# Patient Record
Sex: Male | Born: 1950 | Race: Black or African American | Hispanic: No | Marital: Married | Smoking: Current every day smoker
Health system: Southern US, Community
[De-identification: ages and names within clinical notes are randomized; demographics above are authoritative.]

## PROBLEM LIST (undated history)

## (undated) DIAGNOSIS — R0600 Dyspnea, unspecified: Secondary | ICD-10-CM

## (undated) DIAGNOSIS — F17200 Nicotine dependence, unspecified, uncomplicated: Secondary | ICD-10-CM

## (undated) DIAGNOSIS — I10 Essential (primary) hypertension: Secondary | ICD-10-CM

## (undated) DIAGNOSIS — E7800 Pure hypercholesterolemia, unspecified: Secondary | ICD-10-CM

## (undated) HISTORY — DX: Essential (primary) hypertension: I10

## (undated) HISTORY — DX: Pure hypercholesterolemia, unspecified: E78.00

## (undated) HISTORY — DX: Nicotine dependence, unspecified, uncomplicated: F17.200

---

## 2001-07-12 ENCOUNTER — Encounter: Admission: RE | Admit: 2001-07-12 | Discharge: 2001-07-12 | Payer: Self-pay | Admitting: Family Medicine

## 2001-07-26 ENCOUNTER — Encounter: Admission: RE | Admit: 2001-07-26 | Discharge: 2001-07-26 | Payer: Self-pay | Admitting: Family Medicine

## 2002-05-20 ENCOUNTER — Emergency Department (HOSPITAL_COMMUNITY): Admission: EM | Admit: 2002-05-20 | Discharge: 2002-05-20 | Payer: Self-pay | Admitting: Emergency Medicine

## 2002-06-03 ENCOUNTER — Encounter: Admission: RE | Admit: 2002-06-03 | Discharge: 2002-06-03 | Payer: Self-pay | Admitting: Family Medicine

## 2002-06-06 ENCOUNTER — Encounter: Admission: RE | Admit: 2002-06-06 | Discharge: 2002-06-06 | Payer: Self-pay | Admitting: Family Medicine

## 2002-06-13 ENCOUNTER — Encounter: Admission: RE | Admit: 2002-06-13 | Discharge: 2002-06-13 | Payer: Self-pay | Admitting: Family Medicine

## 2003-07-14 ENCOUNTER — Encounter: Admission: RE | Admit: 2003-07-14 | Discharge: 2003-07-14 | Payer: Self-pay | Admitting: Sports Medicine

## 2004-05-20 ENCOUNTER — Encounter: Admission: RE | Admit: 2004-05-20 | Discharge: 2004-05-20 | Payer: Self-pay | Admitting: Family Medicine

## 2004-06-03 ENCOUNTER — Encounter: Admission: RE | Admit: 2004-06-03 | Discharge: 2004-06-03 | Payer: Self-pay | Admitting: Family Medicine

## 2005-05-23 ENCOUNTER — Ambulatory Visit: Payer: Self-pay | Admitting: Family Medicine

## 2005-07-23 ENCOUNTER — Ambulatory Visit: Payer: Self-pay | Admitting: Family Medicine

## 2005-10-04 ENCOUNTER — Emergency Department (HOSPITAL_COMMUNITY): Admission: AD | Admit: 2005-10-04 | Discharge: 2005-10-04 | Payer: Self-pay | Admitting: Family Medicine

## 2005-10-06 ENCOUNTER — Ambulatory Visit: Payer: Self-pay | Admitting: Family Medicine

## 2005-10-07 ENCOUNTER — Ambulatory Visit: Payer: Self-pay | Admitting: Family Medicine

## 2005-10-14 ENCOUNTER — Ambulatory Visit: Payer: Self-pay | Admitting: Family Medicine

## 2006-02-18 ENCOUNTER — Ambulatory Visit: Payer: Self-pay | Admitting: Family Medicine

## 2006-05-22 ENCOUNTER — Ambulatory Visit: Payer: Self-pay | Admitting: Sports Medicine

## 2006-05-25 ENCOUNTER — Ambulatory Visit: Payer: Self-pay | Admitting: Family Medicine

## 2006-08-25 ENCOUNTER — Ambulatory Visit: Payer: Self-pay | Admitting: Sports Medicine

## 2006-12-24 DIAGNOSIS — E7800 Pure hypercholesterolemia, unspecified: Secondary | ICD-10-CM | POA: Insufficient documentation

## 2006-12-24 DIAGNOSIS — F17200 Nicotine dependence, unspecified, uncomplicated: Secondary | ICD-10-CM

## 2006-12-24 HISTORY — DX: Pure hypercholesterolemia, unspecified: E78.00

## 2006-12-24 HISTORY — DX: Nicotine dependence, unspecified, uncomplicated: F17.200

## 2007-06-04 ENCOUNTER — Ambulatory Visit: Payer: Self-pay | Admitting: Family Medicine

## 2009-12-11 ENCOUNTER — Ambulatory Visit (HOSPITAL_COMMUNITY): Admission: RE | Admit: 2009-12-11 | Discharge: 2009-12-11 | Payer: Self-pay | Admitting: Family Medicine

## 2009-12-11 ENCOUNTER — Ambulatory Visit: Payer: Self-pay | Admitting: Family Medicine

## 2009-12-11 DIAGNOSIS — R0989 Other specified symptoms and signs involving the circulatory and respiratory systems: Secondary | ICD-10-CM | POA: Insufficient documentation

## 2009-12-11 DIAGNOSIS — I10 Essential (primary) hypertension: Secondary | ICD-10-CM

## 2009-12-11 DIAGNOSIS — R0609 Other forms of dyspnea: Secondary | ICD-10-CM

## 2009-12-11 HISTORY — DX: Essential (primary) hypertension: I10

## 2009-12-12 ENCOUNTER — Ambulatory Visit: Payer: Self-pay | Admitting: Family Medicine

## 2009-12-12 ENCOUNTER — Encounter: Payer: Self-pay | Admitting: Family Medicine

## 2009-12-12 LAB — CONVERTED CEMR LAB
ALT: 14 units/L (ref 0–53)
AST: 21 units/L (ref 0–37)
Calcium: 9.5 mg/dL (ref 8.4–10.5)
Chloride: 101 meq/L (ref 96–112)
Creatinine, Ser: 1.13 mg/dL (ref 0.40–1.50)
Sodium: 138 meq/L (ref 135–145)
Total Bilirubin: 0.5 mg/dL (ref 0.3–1.2)
Total CHOL/HDL Ratio: 3.4
Total Protein: 8.2 g/dL (ref 6.0–8.3)
VLDL: 13 mg/dL (ref 0–40)

## 2009-12-18 ENCOUNTER — Encounter: Payer: Self-pay | Admitting: Family Medicine

## 2009-12-18 ENCOUNTER — Ambulatory Visit (HOSPITAL_COMMUNITY): Admission: RE | Admit: 2009-12-18 | Discharge: 2009-12-18 | Payer: Self-pay | Admitting: Family Medicine

## 2009-12-20 ENCOUNTER — Encounter: Payer: Self-pay | Admitting: Family Medicine

## 2009-12-28 ENCOUNTER — Ambulatory Visit: Payer: Self-pay | Admitting: Family Medicine

## 2009-12-28 LAB — CONVERTED CEMR LAB

## 2010-02-18 ENCOUNTER — Ambulatory Visit: Payer: Self-pay | Admitting: Family Medicine

## 2010-02-18 LAB — CONVERTED CEMR LAB

## 2010-11-26 NOTE — Assessment & Plan Note (Signed)
Summary: cpe,df   Vital Signs:  Patient profile:   60 year old male Height:      69 inches Weight:      166 pounds BMI:     24.60 BSA:     1.91 O2 Sat:      98 % on Room air Temp:     98.1 degrees F Pulse rate:   56 / minute BP sitting:   186 / 98  Vitals Entered By: Christen Bame CMA (December 11, 2009 4:00 PM)  O2 Flow:  Room air  Serial Vital Signs/Assessments:  Comments: 4:01 PM Manual BP: 182/100 By: Christen Bame CMA   CC: CPE Is Patient Diabetic? No Pain Assessment Patient in pain? no        Primary Care Provider:  Lynne Leader MD  CC:  CPE.  History of Present Illness: Donald Pacheco comes to clinc asymptomatic wanting a physical exam.  However the visit was switched due to his hypertension.  Hypertension: Never has been diagnosed with HTN before today.  No chest pains, headaches, dyspnea, palpitations, dizzyness, or PND.  He will occasionally get out of breath when climbing several flights of stairs.  However he can walk from his car to the clinic without becoming dyspnec. Only exerbating factor today is stress in his life. His father is very sick, and has been debilitated.  Donald Pacheco is caring for his father, and taking over power of attorney.  He is copeing well but does note that he is more stressed than normal.   Habits & Providers  Alcohol-Tobacco-Diet     Tobacco Status: current     Tobacco Counseling: to quit use of tobacco products     Cigarette Packs/Day: 0.5  Current Medications (verified): 1)  Hydrochlorothiazide 25 Mg Tabs (Hydrochlorothiazide) .Marland Kitchen.. 1 By Mouth Daily 2)  Norvasc 10 Mg Tabs (Amlodipine Besylate) .Marland Kitchen.. 1 By Mouth Daily  Allergies (verified): No Known Drug Allergies  Past History:  Past Medical History: L periorbital cyst- infected 2006, Orbital  Abscess-2003 Noted to have SBPs in office of 180 11/2009  Past Surgical History: None  Social History: Packs/Day:  0.5  Review of Systems       Please see HPI  Physical  Exam  General:  Vs noted and rechecked. Abuliatory pulseox = 97% with several laps of the office. Well appearing male in NAD Mouth:  MMM Lungs:  CTABL Heart:  normal rate, regular rhythm, no murmur, no gallop, no rub, no JVD, and fixed split S2.   Abdomen:  NABS, NT, ND Soft abdominal bruit noted. Appears to be midline.  No pulsitile masses noted.  Extremities:  Budding of the fingers noted.  Has been this way since childhood. Psych:  Oriented X3, memory intact for recent and remote, normally interactive, good eye contact, not anxious appearing, not depressed appearing, not agitated, not suicidal, and not homicidal.     Impression & Recommendations:  Problem # 1:  HYPERTENSION, BENIGN ESSENTIAL (ICD-401.1) Assessment New No urgency or emergency as he does not have acute enf organ damage.  No need for hospitilization at this time.  Plan: Start two anti-hypertensive agents.  Get baseline labs, and EKG.   Will f/u in 2 weeks.   His updated medication list for this problem includes:    Hydrochlorothiazide 25 Mg Tabs (Hydrochlorothiazide) .Marland Kitchen... 1 by mouth daily    Norvasc 10 Mg Tabs (Amlodipine besylate) .Marland Kitchen... 1 by mouth daily  Orders: 12 Lead EKG (12 Lead EKG) 2 D Echo (2 D  Echo) [facility]- Est  Level 4 (99214)Future Orders: Comp Met-[facility] [id number]) ... 12/12/2009 Lipid-[facility] [id number]) ... 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  This will also aid in the characterisitation of his HTN and management.  May need to be on ACEi vs beta blocker.  Orders: [employer]- Est  Level 4 [id number])  Problem # 3:  ABDOMINAL BRUIT ([id number]) Assessment: New  Will check BMP at this time.  Will avoid ACEi unless he developes an indication.  Will need further studies in the future.  Orders: [facility]- Est  Level 4 [id number])  Problem # 4:  TOBACCO  DEPENDENCE (ICD-305.1) Assessment: Unchanged  Will work towards tobacco cessation.  Donald Worrell is in the precontemplation.  Orders: [facility]- Est  Level 4 [id number])  Complete Medication List: 1)  Hydrochlorothiazide 25 Mg Tabs (Hydrochlorothiazide) .Marland Kitchen.. 1 by mouth daily 2)  Norvasc 10 Mg Tabs (Amlodipine besylate) .Marland Kitchen.. 1 by mouth daily  Other Orders: Pulse Oximetry- [place] [id number])  Patient Instructions: 1)  Thank you for seeing me today. 2)  If you have chest pain, difficulty breathing, fevers over 102 that does not get better with tylenol please call us or see a doctor.  3)  Please come back tomorrow morning for a lab draw.  Do not eat or drink anything but water.   4)  Please schedule an appointment with me in 2 weeks. 5)  Tobacco is very bad for your health and your loved ones ! You should stop smoking !  6)  Stop smoking tips: Choose a quit date. Cut down before the quit date. Decide what you will do as a substitute when you feel the urge to smoke(gum, toothpick, exercise).  Prescriptions: NORVASC 10 MG TABS (AMLODIPINE BESYLATE) 1 by mouth daily  #30 x 6   Entered and Authorized by:   Lynne Leader MD   Signed by:   Lynne Leader MD on 12/11/2009   Method used:   Electronically to        [facility] (retail)       [place], [place]  [id number]       Ph: [phone]       Fax: [phone]   RxIDFS:[id number] HYDROCHLOROTHIAZIDE 25 MG TABS (HYDROCHLOROTHIAZIDE) 1 by mouth daily  #30 x 6   Entered and Authorized by:   Lynne Leader MD   Signed by:   Lynne Leader MD on 12/11/2009   Method used:   Electronically to        [facility] (retail)       [address]       [place], [place]  [id number]       Ph: [phone]       Fax: [phone]   RxID:   [id number]

## 2010-11-26 NOTE — Assessment & Plan Note (Signed)
Summary: F/U LABS/[facility]   Vital Signs:  Patient profile:   60 year old male Weight:      163.4 pounds Temp:     97.9 degrees F oral Pulse rate:   82 / minute Pulse rhythm:   regular BP sitting:   148 / 84  (left arm)  Vitals Entered By: Audelia Hives CMA (December 28, 2009 2:46 PM)  Primary Care Provider:  Lynne Leader MD  CC:  f/u BP and and smoking.  History of Present Illness: Donald Pacheco has thought a great deal about his smoking and would like to quit smoking today. He smokes 1/2 PPD with a 30 pack year history.  He smokes within 10 minuts of waking up. He does not wake up to smoke.  He has quit for 1 month in the past.   HTN:  He has been taking his BP medications as directed and feeling well.  He deneis any chest pain, palpatations, dyspnea or headaches.     Habits & Providers  Alcohol-Tobacco-Diet     Tobacco Status: current     Tobacco Counseling: to quit use of tobacco products     Cigarette Packs/Day: 0.5     Year Started: 1970     Pack years: 30  Current Problems (verified): 1)  Abdominal Bruit  (ICD-785.9) 2)  Hypertension, Benign Essential  (ICD-401.1) 3)  Dyspnea On Exertion  (ICD-786.09) 4)  Tobacco Dependence  (ICD-305.1) 5)  Hypercholesterolemia  (ICD-272.0)  Current Medications (verified): 1)  Hydrochlorothiazide 25 Mg Tabs (Hydrochlorothiazide) .Marland Kitchen.. 1 By Mouth Daily 2)  Norvasc 10 Mg Tabs (Amlodipine Besylate) .Marland Kitchen.. 1 By Mouth Daily 3)  Zyban 150 Mg Xr12h-Tab (Bupropion Hcl (Smoking Deter)) .Marland Kitchen.. 1 By Mouth X 3days Then Two Times A Day.  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 Well appearing male in NAD Mouth:  MMM Lungs:  CTABL Heart:  RRR no MRG no splitting today. Abdomen:  NABS, NT, ND Soft abdominal bruit noted. Appears to be midline.  No pulsitile masses noted.  Extremities:  Budding of the fingers noted.  Has been this way since childhood.   Impression & Recommendations:  Problem # 1:  TOBACCO DEPENDENCE (ICD-305.1) Assessment Unchanged  Wants to quit.  Quit Date is March 14th.  Will start Zyban on March 7th. Will call the quitline  on March 7th,14th and a week later. Will f/u with me in April. His updated medication list for this problem includes:    Zyban 150 Mg Xr12h-tab (Bupropion hcl (smoking deter)) .Marland Kitchen... 1 by mouth x 3days then two times a day.  start 7 days before quit date  Orders: [facility]- Est  Level 4 [id number])  Problem # 2:  HYPERTENSION, BENIGN ESSENTIAL (ICD-401.1) Assessment: Improved  BP is resonable control at the time being.  Will f/u in April.  Will avoid any chnages now to let Donald Babcock concentrate on his smoking cessation. His updated medication list for this problem includes:    Hydrochlorothiazide 25 Mg Tabs (Hydrochlorothiazide) .Marland Kitchen... 1 by mouth daily    Norvasc 10 Mg Tabs (Amlodipine besylate) .Marland Kitchen... 1 by mouth daily  BP today: 148/84 Prior BP: 186/98 (12/11/2009)  Labs Reviewed: K+: 4.5 (12/12/2009) Creat: : 1.13 (12/12/2009)   Chol: 252 (12/12/2009)   HDL: 74 (12/12/2009)   LDL: 165 (12/12/2009)   TG: 63 (12/12/2009)  Orders: [facility]- Est  Level 4 (99214)  Problem # 5:  HYPERCHOLESTEROLEMIA (ICD-272.0) Assessment: New  Elevated LDL.  Goal should be 130 as he is a male with HTN and smoking.   Will concentrate on smoking cessation now and then work with diet and exercise modification to lower lipids.  Will consider a statin if no change.  Labs Reviewed: SGOT: 21 (12/12/2009)   SGPT: 14 (12/12/2009)   HDL:74 (12/12/2009)  LDL:165 (12/12/2009)  Chol:252 (12/12/2009)  Trig:63 (12/12/2009)  Orders: [facility]- Est  Level 4 [id number])  Complete Medication List: 1)  Hydrochlorothiazide 25 Mg Tabs (Hydrochlorothiazide) .Marland Kitchen.. 1 by mouth daily 2)  Norvasc 10 Mg Tabs (Amlodipine besylate) .Marland Kitchen.. 1 by mouth daily 3)  Zyban 150 Mg Xr12h-tab (Bupropion hcl (smoking deter)) .Marland Kitchen.. 1 by mouth x 3days then two times a day.  start 7 days before quit date  Patient Instructions: 1)  Thank you for seeing me today. 2)  Monday March 14th is the quit date 3)  On Monday march 7th start taking the Zyban 1 pill daily.  On Thursday take it twice a day.  Then on Monday the 14th stop smoking. 4)  Please call the Quitline.  1-800-Quit-Now Today and when you start taking the medicine and on your quit date.   5)  Check in with me in early April.  We will talk about your blood pressure then.   Prescriptions: ZYBAN 150 MG XR12H-TAB (BUPROPION HCL (SMOKING DETER)) 1 by mouth x 3days then two times a day.  Start 7 days before quit date  #30 x 4   Entered and Authorized by:   Lynne Leader MD   Signed by:   Lynne Leader MD on 12/28/2009   Method used:   Print then Give to Patient   RxID:   [id number]    Prevention & Chronic Care Immunizations   Influenza vaccine: Not documented   Influenza vaccine deferral: Deferred  (12/28/2009)    Tetanus booster: 05/01/2005: Done.    Pneumococcal vaccine: Not documented  Colorectal Screening   Hemoccult: Done.  (05/01/2005)   Hemoccult action/deferral: Deferred  (12/28/2009)    Colonoscopy: Not documented   Colonoscopy action/deferral: Deferred  (12/28/2009)  Other Screening   PSA: Not documented   PSA action/deferral: Discussed-decision deferred  (12/28/2009)   Smoking  status: current  (12/28/2009)   Smoking cessation counseling: YES  (12/11/2009)  Lipids   Total Cholesterol: 252  (12/12/2009)   LDL: 165  (12/12/2009)   LDL Direct: Not documented   HDL: 74  (12/12/2009)   Triglycerides: 63  (12/12/2009)    SGOT (AST): 21  (12/12/2009)   SGPT (ALT): 14  (12/12/2009)   Alkaline phosphatase: 70  (12/12/2009)   Total bilirubin: 0.5  (12/12/2009)    Lipid flowsheet reviewed?: Yes   Progress toward LDL goal: Unchanged  Hypertension   Last Blood Pressure: 148 / 84  (12/28/2009)   Serum creatinine: 1.13  (12/12/2009)   Serum potassium 4.5  (  12/12/2009)    Hypertension flowsheet reviewed?: Yes   Progress toward BP goal: Improved  Self-Management Support :   Personal Goals (by the next clinic visit) :      Personal blood pressure goal: 140/90  (12/28/2009)     Personal LDL goal: 130  (12/28/2009)    Patient will work on the following items until the next clinic visit to reach self-care goals:     Medications and monitoring: take my medicines every day, check my blood pressure, weigh myself weekly  (12/28/2009)    Hypertension self-management support: Not documented    Lipid self-management support: Not documented

## 2010-11-26 NOTE — Assessment & Plan Note (Signed)
Summary: f/u meds,df   Vital Signs:  Patient profile:   60 year old male Height:      69 inches Weight:      159 pounds BMI:     23.57 Temp:     98.2 degrees F oral Pulse rate:   80 / minute BP sitting:   128 / 85  (right arm) Cuff size:   regular  Vitals Entered By: Schuyler Amor CMA (February 18, 2010 3:16 PM) CC: F/U meds Is Patient Diabetic? No Pain Assessment Patient in pain? no        Primary Care Provider:  Lynne Leader MD  CC:  F/U meds.  History of Present Illness: Donald Pacheco presents to clinic today to follow up his HTN, Cholesterol and Smoking  HTN Donald Pacheco is taking HCTZ and Amlodipine as directed and doing well. He denies any chest pain, syncope, palpitation or dizzyness.   He is happy with his BP control.  Dyslipidemia: Donald Pacheco knows that his cholestrol is high and is still smoking.  He is OK with trying a cholesterol medication on the condition that he can stop it if he stops smoking.   Smoking:  Donald Pacheco did not keep his quit date.  His father who was very sick recently died. Understandibly Donald Pacheco decided that now was not a good time to quit.  He remains eager and determined to quit smoking.  He thinks that some time next month will be a good time. He does still have the zyban Rx.   Habits & Providers  Alcohol-Tobacco-Diet     Tobacco Status: current     Tobacco Counseling: to quit use of tobacco products     Cigarette Packs/Day: 0.5  Current Problems (verified): 1)  Screening, Colon Cancer  (ICD-V76.51) 2)  Abdominal Bruit  (ICD-785.9) 3)  Hypertension, Benign Essential  (ICD-401.1) 4)  Dyspnea On Exertion  (ICD-786.09) 5)  Tobacco Dependence  (ICD-305.1) 6)  Hypercholesterolemia  (ICD-272.0)  Current Medications (verified): 1)  Hydrochlorothiazide 25 Mg Tabs (Hydrochlorothiazide) .Marland Kitchen.. 1 By Mouth Daily 2)  Norvasc 10 Mg Tabs (Amlodipine Besylate) .Marland Kitchen.. 1 By Mouth Daily 3)  Zyban 150 Mg Xr12h-Tab (Bupropion Hcl (Smoking Deter)) .Marland Kitchen.. 1 By Mouth  X 3days Then Two Times A Day.  Start 7 Days Before Quit Date 4)  Pravastatin Sodium 40 Mg Tabs (Pravastatin Sodium) .Marland Kitchen.. 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    Physical Exam  General:  Vs noted.  Well male in NAD Mouth:  MMM Lungs:  CTABL Heart:  RRR no MRG no splitting today. Abdomen:  NABS, NT, ND  Extremities:  Budding of the fingers noted.  Has been this way since  childhood. Non edemetus BL LE Psych:  normally interactive, good eye contact, not anxious appearing, and not depressed appearing.     Impression & Recommendations:  Problem # 1:  HYPERTENSION, BENIGN ESSENTIAL (ICD-401.1) Assessment Improved  At goal today. No changes planned.   His updated medication list for this problem includes:    Hydrochlorothiazide 25 Mg Tabs (Hydrochlorothiazide) .Marland Kitchen... 1 by mouth daily    Norvasc 10 Mg Tabs (Amlodipine besylate) .Marland Kitchen... 1 by mouth daily  BP today: 128/85 Prior BP: 148/84 (12/28/2009)  Labs Reviewed: K+: 4.5 (12/12/2009) Creat: :  1.13 (12/12/2009)   Chol: 252 (12/12/2009)   HDL: 74 (12/12/2009)   LDL: 165 (12/12/2009)   TG: 63 (12/12/2009)  Orders: [facility]- Est  Level 4 (99214)  Problem # 2:  HYPERCHOLESTEROLEMIA (ICD-272.0) Assessment: Unchanged  As Donald Pacheco has not quit tobacco plan to start pravastatin for a LDL goal of 130.  Will plan to follow up his lipids in 2 months. His updated medication list for this problem includes:    Pravastatin Sodium 40 Mg Tabs (Pravastatin sodium) .Marland Kitchen... 1 by mouth at night  Labs Reviewed: SGOT: 21 (12/12/2009)   SGPT: 14 (12/12/2009)   HDL:74 (12/12/2009)  LDL:165 (12/12/2009)  Chol:252 (12/12/2009)  Trig:63 (12/12/2009)  Orders: [facility]- Est  Level 4 [id number])  Problem # 3:  TOBACCO DEPENDENCE (ICD-305.1) Assessment: Unchanged  Did not quit as noted earlier. Plan to follow up smoking in 2 months.  Will have a quitdate next month.  Pt is determined.  His updated medication list for this problem includes:    Zyban 150 Mg Xr12h-tab (Bupropion hcl (smoking deter)) .Marland Kitchen... 1 by mouth x 3days then two times a day.  start 7 days before quit date  Encouraged smoking cessation and discussed different methods for smoking cessation.   Orders: [facility]- Est  Level 4 [id number])  Problem # 4:  Preventive Health Care (ICD-V70.0) Discussed colon cancer screening will do hemeoccult cards for now.  Pt understands how to use them and to mail them back, Discussed PSA testing. Patient elects to not test at this time.   Complete Medication List: 1)  Hydrochlorothiazide 25 Mg Tabs (Hydrochlorothiazide) .Marland Kitchen.. 1 by mouth daily 2)  Norvasc 10 Mg Tabs (Amlodipine besylate) .Marland Kitchen.. 1 by mouth daily 3)  Zyban 150 Mg Xr12h-tab (Bupropion hcl (smoking deter)) .Marland Kitchen.. 1 by mouth x 3days then two times a day.  start 7 days before quit date 4)  Pravastatin Sodium 40 Mg Tabs (Pravastatin sodium) .Marland Kitchen.. 1 by mouth at night  Patient Instructions: 1)  Thank you for seeing me today. 2)  Please set a keep a quitdate.  Call the  quitline at 1-800-quitnow 3)  Please schedule a follow-up appointment in 2 months.  4)  We will get blood tests then. 5)  Come back if you feel bad.  6)  Please send in the hemeoccult cards. Prescriptions: PRAVASTATIN SODIUM 40 MG TABS (PRAVASTATIN SODIUM) 1 by mouth at night  #30 x 12   Entered and Authorized by:   Lynne Leader MD   Signed by:   Lynne Leader MD on 02/18/2010   Method used:   Electronically to        [facility] (retail)       [address]       [place], [place]  [id number]       Ph: [phone]       Fax: [phone]   RxIDMB:[id number]    Prevention &  Chronic Care Immunizations   Influenza vaccine: Not documented   Influenza vaccine deferral: Not available  (02/18/2010)    Tetanus booster: 05/01/2005: Done.    Pneumococcal vaccine: Not documented  Colorectal Screening   Hemoccult: Done.  (05/01/2005)   Hemoccult action/deferral: Ordered  (02/18/2010)    Colonoscopy: Not documented   Colonoscopy action/deferral: Deferred  (12/28/2009)  Other Screening   PSA: Not documented   PSA action/deferral: Discussed-PSA declined  (02/18/2010)   Smoking status: current  (02/18/2010)   Smoking cessation counseling: YES  (12/11/2009)  Lipids   Total Cholesterol: 252  (12/12/2009)   LDL: 165  (12/12/2009)   LDL Direct: Not documented   HDL: 74  (12/12/2009)   Triglycerides: 63  (12/12/2009)    SGOT (AST): 21  (12/12/2009)   SGPT (ALT): 14  (12/12/2009)   Alkaline phosphatase: 70  (12/12/2009)   Total bilirubin: 0.5  (12/12/2009)    Lipid flowsheet reviewed?: Yes   Progress toward LDL goal: Unchanged  Hypertension   Last Blood Pressure: 128 / 85  (02/18/2010)   Serum creatinine: 1.13  (12/12/2009)   Serum potassium 4.5  (12/12/2009)    Hypertension flowsheet reviewed?: Yes   Progress toward BP goal: At goal  Self-Management Support :   Personal Goals (by the next clinic visit) :      Personal blood pressure goal:  140/90  (12/28/2009)     Personal LDL goal: 130  (12/28/2009)    Patient will work on the following items until the next clinic visit to reach self-care goals:     Medications and monitoring: take my medicines every day, bring all of my medications to every visit, weigh myself weekly  (02/18/2010)    Hypertension self-management support: Not documented    Lipid self-management support: Not documented

## 2011-03-25 ENCOUNTER — Inpatient Hospital Stay (INDEPENDENT_AMBULATORY_CARE_PROVIDER_SITE_OTHER)
Admission: RE | Admit: 2011-03-25 | Discharge: 2011-03-25 | Disposition: A | Discharge status: Home / Self Care | Payer: Self-pay | Source: Ambulatory Visit | Attending: Family Medicine | Admitting: Family Medicine

## 2011-03-25 DIAGNOSIS — H109 Unspecified conjunctivitis: Secondary | ICD-10-CM

## 2013-02-18 ENCOUNTER — Encounter: Payer: Self-pay | Admitting: Family Medicine

## 2013-02-18 ENCOUNTER — Ambulatory Visit (INDEPENDENT_AMBULATORY_CARE_PROVIDER_SITE_OTHER): Payer: Self-pay | Admitting: Family Medicine

## 2013-02-18 VITALS — BP 150/97 | HR 69 | Ht 68.0 in | Wt 155.0 lb

## 2013-02-18 DIAGNOSIS — E7800 Pure hypercholesterolemia, unspecified: Secondary | ICD-10-CM

## 2013-02-18 DIAGNOSIS — L989 Disorder of the skin and subcutaneous tissue, unspecified: Secondary | ICD-10-CM

## 2013-02-18 DIAGNOSIS — I10 Essential (primary) hypertension: Secondary | ICD-10-CM

## 2013-02-18 DIAGNOSIS — R0989 Other specified symptoms and signs involving the circulatory and respiratory systems: Secondary | ICD-10-CM

## 2013-02-18 DIAGNOSIS — F17200 Nicotine dependence, unspecified, uncomplicated: Secondary | ICD-10-CM

## 2013-02-18 MED ORDER — HYDROCHLOROTHIAZIDE 25 MG PO TABS: 25.0000 mg | ORAL_TABLET | Freq: Every day | ORAL | Status: DC

## 2013-02-18 NOTE — Assessment & Plan Note (Signed)
Advised cessation. Patient wants to quit but not ready for quit date. Will refer to [facility]. Also advised 1-800 quit now.

## 2013-02-18 NOTE — Assessment & Plan Note (Signed)
Not heard on exam today  

## 2013-02-18 NOTE — Progress Notes (Addendum)
Subjective:  Patient presents today to reestablish care (last seen 11/2009). Technically here for SDA for eye knot but as history had not been updated in epic, this was fully reviewed. Chief complaint-noted.   1. Right eye knot x 1 year-  Small knot over right eye in eyebrow going on for a year. Felt knot and perhaps some fluid in upper eyelid during this tmie but never really bothered patient.  Over 1-2 months has increased in size. No pain, tingling. No blurry vision. Sometime eyelid will be a little more shut. No fevers/chills/nausea/vomiting. He does push on it a lot and rub it just because he notices it and this causes some redness but no expanding redness. No drainage.   4-5 years ago had similar knot on the left size and they did an incision and drainage at that time and it has not recurred. Did not have fevers/redness. Does not remember them calling it an abscess. Did have smelly drainage-not sure if it was a sebaceous cyst.   2. Tobacco abuse-8/10 importance, 7/10 readiness, 5/10 confidence. Asked patient if he would like to set a quit date and he is nto ready currently. Is interested in 1-800-quit-now (and ? Free patches) as well as [facility]. Has quit cold [place] for 6-9 months several years ago. Looks like has tried welbutrin per meds listed. Has tried patches within last 5 years but didn't have regular follow up.   3. Hypertension- BP Readings from Last 3 Encounters:  02/18/13 150/97  02/18/10 128/85  12/28/09 148/84   Home BP monitoring-no Compliant with medications-previously on hctz and amlodipine. No current medications.  Denies any CP, HA, SOB, blurry vision, LE edema, transient weakness, orthopnea, PND.   The following were reviewed and entered/updated in epic: Past Medical History  Diagnosis Date  . HYPERCHOLESTEROLEMIA 12/24/2006  . TOBACCO DEPENDENCE 12/24/2006    40 years 1/2 PPD. 20 pack years.   Marland Kitchen HYPERTENSION, BENIGN ESSENTIAL 12/11/2009   No past surgeries.   Medications- reviewed and updated (no current meds) Reviewed problem list.  Allergies-reviewed and updated History   Social History  . Marital Status: Married   Social History Main Topics  . Smoking status: Current Every Day Smoker -- 0.50 packs/day for 40 years    Types: Cigarettes  . Smokeless tobacco: None  . Alcohol Use: 1.5 oz/week    3 drink(s) per week  . Drug Use: No     Comment: marijuana many years ago  . Sexually Active: Yes -- Male partner(s)    Birth Control/ Protection: None     Comment: married   Other Topics Concern  . 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       Married with 3 kids (5 grandkids).     ROS--See HPI   Objective: BP 150/97  Pulse 69  Ht 5\' 8"  (1.727 m)  Wt 155 lb (70.308 kg)  BMI 23.57 kg/m2 Gen: NAD, resting comfortably in chair HEENT: NCAT, MMM, PERRLA   Skin: 2 cm mobile nodule in right eyebrow with slight erythema over nodule itself but not in surrounding area. Does have slight droop in right eyelid (chronic since nodule has enlarged per patient). Poor dentition (multiple caries)  CV: RRR no mrg  Lungs: CTAB  Abd: soft/nontender/nondistended/normal bowel sounds . Do not hear bruit that was previously noted.  Ext: no edema  Skin: warm and dry, no rash  Neuro: grossly normal, moves all extremities   Assessment/Plan:  Advised patient to see dentist but has no insurance.

## 2013-02-18 NOTE — Assessment & Plan Note (Signed)
Appears to be sebaceous cyst (had Dr. Walker Kehr observe it as well) and previous left eye lesion sounds like cyst as well. Patient deferred i+D today and will have this done with PCP in coming weeks (patient wants done for cosmetic reasons but also for droop of eyelid associated with it.

## 2013-02-18 NOTE — Assessment & Plan Note (Signed)
Previously on statin. Defers labwork today (no insurance). Patient will discuss with PCP whether to start statin without baseline labs.

## 2013-02-18 NOTE — Assessment & Plan Note (Signed)
Poorly controlled. Restart home HCTZ previously taken. Follow up at Dr. Ebony Hail next available appointment. May need additional agent (left amlodipine on list)

## 2013-02-18 NOTE — Patient Instructions (Signed)
This knot looks like a sebaceous cyst. I want you to come back to see Dr. Venetia Maxon so he can do an incision and drainage (this can take some time as we have to numb you up).   For your blood pressure, restart hydrochlorothiazide which was sent to your pharmacy. Also, a baby aspirin a day would be a good idea for you to start. You deferred blood work today but we need to check your cholesterol eventually.   For your smoking, try 1-800-quit now to see if they have free patches. When you are ready to quit, let us know and we can help you further.  I am going to tell [facility] team about you and they may give you a call.   Follow up at his next available appointment, Dr. Yong Channel  For insurance, start with healthcare.org to try to obtain insurance. The I+D procedure-I am not sure of the cost-but you can ask at the front.

## 2013-02-25 ENCOUNTER — Encounter: Payer: Self-pay | Admitting: Family Medicine

## 2013-02-25 ENCOUNTER — Ambulatory Visit (INDEPENDENT_AMBULATORY_CARE_PROVIDER_SITE_OTHER): Payer: Self-pay | Admitting: Family Medicine

## 2013-02-25 VITALS — BP 146/94 | HR 82 | Temp 98.2°F | Ht 68.0 in | Wt 149.2 lb

## 2013-02-25 DIAGNOSIS — H02829 Cysts of unspecified eye, unspecified eyelid: Secondary | ICD-10-CM

## 2013-02-25 DIAGNOSIS — I10 Essential (primary) hypertension: Secondary | ICD-10-CM

## 2013-02-25 DIAGNOSIS — L723 Sebaceous cyst: Secondary | ICD-10-CM

## 2013-02-25 DIAGNOSIS — H02823 Cysts of right eye, unspecified eyelid: Secondary | ICD-10-CM

## 2013-02-25 DIAGNOSIS — L089 Local infection of the skin and subcutaneous tissue, unspecified: Secondary | ICD-10-CM | POA: Insufficient documentation

## 2013-02-25 DIAGNOSIS — E7800 Pure hypercholesterolemia, unspecified: Secondary | ICD-10-CM

## 2013-02-25 MED ORDER — HYDROCODONE-ACETAMINOPHEN 5-325 MG PO TABS: 1.0000 | ORAL_TABLET | ORAL | Status: DC | PRN

## 2013-02-25 MED ORDER — AMLODIPINE BESYLATE 10 MG PO TABS: 10.0000 mg | ORAL_TABLET | Freq: Every day | ORAL | Status: DC

## 2013-02-25 MED ORDER — DOXYCYCLINE HYCLATE 100 MG PO TABS: 100.0000 mg | ORAL_TABLET | Freq: Two times a day (BID) | ORAL | Status: DC

## 2013-02-25 NOTE — Patient Instructions (Signed)
Thank you for coming in, today. It was nice to meet you. For your eye:  Change the dressing with fresh gauze and tape, 1-2 times per day.  DO NOT remove the packing inside your skin when you change your dressing.  Use ice in a bag or an icepack as needed for pain and to help with the swelling.  Use ice "on for fifteen minutes, off for fifteen minutes" for an hour or two at a time.  Make sure to take the antibiotic I prescribed until you finish the pills.  The antibiotic is doxycycline, 100 mg pills, 1 pill, twice a day, for 10 days.  You can also take hydrocodone as needed for pain.  Make an appointment to come back on Monday to have your eye checked.  We might re-pack it, at that point, and we will see how it is healing.  If you have ANY of the following, call the clinic or go to the emergency room:   Persistent fever over 100.4.    (You may have some increased temperature today, due to the cut and draining).   Increased redness and swelling around your eye.   Severe pain with moving your eye or inability to move your eye.   Nausea/vomiting that persists.   Any of these could mean that your infection is spreading or getting worse. For your blood pressure:  I sent in a prescription for the amlodipine that you have taken before.  Keep taking the HCTZ.  We will check your blood pressure at your follow-up visits and adjust your medicine as we need. We will talk more about your cholesterol in the future. Please feel free to call with any questions or concerns at any time, at [phone]. --Dr. Venetia Maxon

## 2013-02-26 ENCOUNTER — Encounter: Payer: Self-pay | Admitting: Family Medicine

## 2013-02-26 NOTE — Progress Notes (Signed)
Subjective:    Patient ID: Donald Pacheco, male    DOB: [birth year], 62 y.o.   MRN: [id number]  HPI: Pt presents for f/u of BP, to discuss his cholesterol, and to have a sebaceous cyst over his right eye drained and/or removed.  Sebaceous cyst: Cyst over pt's right eye has grown larger over the past few days, is more tender/inflammed, and continues to interfere with his vision and has prevented him from working (pt works for [employer]). The cyst is about twice as big as it was when pt was seen by Dr. Yong Channel on 4/25. He denies systemic symptoms of fever/chills. He has not noticed any surrounding erythema or other rash to suggest cellulitis. He does state that yesterday he was nauseated and vomited twice, which he thought may have been due to his BP medicines; however, he has not had any difficulty or noticed significant side effects with BP meds before (see immediately below).  BP: Pt's BP is improved today. Pt has been compliant with HCTZ as prescribed by Dr. Yong Channel. Denies new headache, change in vision. Pt reportedly tolerated HCTZ and amlodipine in the past (according to previous chart records); pt states that he does not specifically recall any complications/intolerance of medicines but also states he did not remember the names of exactly what meds he had used in the past, either.  Cholesterol: Pt has previously been on a statin but is unable to afford a lab draw at this time. Pt would prefer to have labs checked before starting a new medication. Pt reportedly tolerated a statin well in the past.  Review of Systems: As above.     Objective:   Physical Exam BP 146/94  Pulse 82  Temp(Src) 98.2 F (36.8 C) (Oral)  Ht 5\' 8"  (1.727 m)  Wt 149 lb 3.2 oz (67.677 kg)  BMI 22.69 kg/m2 Gen: non-toxic appearing adult male in NAD, very pleasant and cooperative HEENT: enlarged, ~5 cm x 4 cm cyst-like mass over right eye causing some droop of the upper eyelid, very tender and mildly  erythematous  No surrounding erythema, induration, or tenderness; EOMI bilaterally with clear sclerae and conjunctivae Skin: otherwise warm, dry, intact Cardio: RRR, no murmur appreciated Ext: gait normal, distal pulses intact     Assessment & Plan:  Precepted with Dr. Andria Frames. See problem list notes. Given appearance and enlargement of cyst since pt's last visit, an I&D was performed rather than an attempted intact removal of the cyst.  Procedure Note: I&D of infected sebaceous cyst of face Present personnel: Dr. Andria Frames, Dr. Venetia Maxon, Enid Skeens, CMA After explaining the procedure and obtaining written and verbal consent, the pt was placed in a supine position on an exam table. Pt's right eye was covered with a piece of plain gauze and pt's head was rotated slightly to the right. The skin overlying the swollen, infected cyst over his right eye was cleaned with alcohol and approximately 3 mL of xylocaine plus epinephrine was injected into the skin just inferior to the eyebrow line, into the skin and cyst itself. The cyst and approximately 2 cm of surrounding skin were cleaned and prepped with iodine solution using typical sterile technique. Using a #11 disposable scalpel blade, an approximately 1.5 cm stab incision was made in two motions along the line of local anaesthetic injection as described above. Approximately 20 mL of bloody purulent material was expressed using hand pressure against the cyst. Sterile curved hemostats were used to disrupt as much loculation as possible within the  cyst cavity and small pieces of the cyst wall were removed. Approximately 5 cm of plain packing was introduced into the wound using the sterile hemostat and sterile forceps, with about 1 cm of packing left protruding. The skin around the wound was cleaned with alcohol swabs and a plain gauze dressing was applied over the wound. Estimated blood loss for the procedure was less than 10 mL. Pt tolerated the procedure  well with no immediate complications. Pt and wife were instructed on wound dressing changes and provided with clean supplies. Dr. Andria Frames was present for and assisted with the procedure in its entirety.

## 2013-02-26 NOTE — Assessment & Plan Note (Signed)
A: Discussed with Dr. Andria Frames. Cyst appears roughly twice as large as described in Dr. Ansel Bong note from pt's recent visit, and clinically appears very inflammed and likely infected. No definite systemic symptoms other than single episode of emesis the day prior to this visit; no findings on exam to suggest cellulitis. Given infection, it was felt I&D today with possible excision of cyst later was more appropriate than attempting to remove the cyst intact.  P: I&D in the office, assisted by Dr. Andria Frames per procedure note included with this visit's progress note. Rx provided for doxycycline 100 mg BID for 10 days as well as Norco 5-325 mg 1-2 tabs q4 PRN. Pt is to f/u on Monday 5/5 for reassessment of the cyst and procedure wound, as well as either removal of packing and/or repacking. Pt and wife instructed on the use of cold compresses and regular dressing changes, as well as red flags that would prompt immediate return to clinic or presentation to the ED before his f/u on Monday. Pt was provided with clean materials for dressing changes. Will likely plan to remove cyst if/when it recurs, once the acute infection is resolved.

## 2013-02-27 NOTE — Assessment & Plan Note (Addendum)
A: Previously on a statin medication (Pravachol 40 mg, per chart review). Pt defers lab draw for lipid panel at this time due to cost and no insurance. I would prefer to see current lipid status and evaluate LFT's prior to restarting Pravachol or starting another statin.  P: Will re-address at follow-up; if/when pt is agreeable/able to get labs drawn, will check a fasting lipid panel and a CMP, to evaluate both LFT's and chemistries with kidney function (pt recently restarted on HCTZ, and amlodipine was re-added this visit, for HTN).

## 2013-02-27 NOTE — Assessment & Plan Note (Signed)
A: Remains high, but control improving with HCTZ. Pt agreeable to starting amlodipine, as well (per records, pt has been on this in the past, but does not recall which medicines he has been on, himself).   P: Rx for amlodinpine 10 mg added. Will continue to follow up routinely and monitor for side effects. Hopeful for pt to agree to lab draw in the near future to evaluate kidney function with recently-restarted HCTZ (see HLD problem list note).

## 2013-02-27 NOTE — Progress Notes (Signed)
This encounter was created in error - please disregard.

## 2013-02-27 NOTE — Assessment & Plan Note (Signed)
A: BP remains high, but control is improving with HCTZ. Pt agreeable to re-starting amlodipine, as well (per records, pt has been on this in the past, but does not recall which medications he has been on, himself).  P: Rx for amlodipine 10 mg added. Will continue to follow up routinely and monitor for side effects. Hopeful for pt to agree to lab draw in the relatively near future to evaluate kidney function/chemistries with recently-restarted HCTZ (see also HLD problem list note).

## 2013-02-27 NOTE — Assessment & Plan Note (Signed)
A: Previously on a statin medication (Pravachol 40 mg, per chart review). Pt defers lab draw for lipid panel at this time due to cost and no insurance. I would prefer to see current lipid status and to evaluate LFT's prior to restarting Pravachol or starting another statin.  P: Will re-address at follow-up; if/when pt is agreeable/able to get labs draw, will check a fasting lipid panel and a CMP, to evaluate both LFT's and chemistries with kidney function (pt recently restarted on HCTZ, and amlodipine was re-added this visit, for HTN); will attempt to minimize blood draws due to cost to pt, but will consider adding other labs, as appropriate.

## 2013-02-28 ENCOUNTER — Encounter: Payer: Self-pay | Admitting: Family Medicine

## 2013-02-28 ENCOUNTER — Ambulatory Visit (INDEPENDENT_AMBULATORY_CARE_PROVIDER_SITE_OTHER): Payer: Self-pay | Admitting: Family Medicine

## 2013-02-28 VITALS — BP 125/80 | HR 73 | Temp 98.3°F | Ht 68.0 in | Wt 151.0 lb

## 2013-02-28 DIAGNOSIS — I10 Essential (primary) hypertension: Secondary | ICD-10-CM

## 2013-02-28 DIAGNOSIS — L089 Local infection of the skin and subcutaneous tissue, unspecified: Secondary | ICD-10-CM

## 2013-02-28 DIAGNOSIS — L723 Sebaceous cyst: Secondary | ICD-10-CM

## 2013-02-28 NOTE — Assessment & Plan Note (Signed)
A: Improving s/p I&D 4 days ago. Tolerating pain well. No new systemic symptoms or local worsening of infection.  P: Continue doxycycline to complete 10-day course. Continue daily dressing changes. Advised gentle normal local hygiene. Plan to f/u in 1-2 weeks. If infection resolved and cyst still present/stabilized by that point, will consider full cyst removal.

## 2013-02-28 NOTE — Progress Notes (Signed)
  Subjective:    Patient ID: Donald Pacheco, male    DOB: 1951-07-06, 62 y.o.   MRN: [id number]  HPI: Pt presents to clinic for f/u of infected sebaceous cyst I&D over his right eye four days ago, and for BP f/u.   Recent I&D: Pt states the lesion's swelling has greatly improved. It is still mildy tender but the I&D wound is healing well. He has noted no new bleeding or draining. The packing placed on 5/2 came out over the weekend. He has not noted any increased redness or swelling around the area. He has been taking doxycycline as prescribed. Pt only has used Norco "a couple of tablets," because the pain has not been too bad. Pt has also had good relief with cold compresses.  BP: BP much better controlled on HCTZ, also now on amlodipine (started 3 days ago). No new LE edema noted. No headaches, chest pain, change in vision.  Review of Systems: As above. No systemic symptoms; specifically denies fever/chills, N/V, abdominal pain, SOB. No increased local swelling/redness, and improved tenderness as above.     Objective:   Physical Exam BP 125/80  Pulse 73  Temp(Src) 98.3 F (36.8 C) (Oral)  Ht 5\' 8"  (1.727 m)  Wt 151 lb (68.493 kg)  BMI 22.96 kg/m2 Gen: well-appearing adult male in NAD HEENT: dressing initially in place over I&D wound; otherwise [place]/AT, EOMI, PERRLA Cardio: RRR, no murmur Pulm: CTAB, no wheezes Ext: warm, well-perfused, no LE edema; marked clubbing in fingers Skin: well-healing ~1 cm I&D wound in the eyebrow line superior to right eye, no evidence of new/developing cellulitis  Some surrounding soft tissue swelling consistent with resolving cyst/abscess  No fluctuance to suggest remaining/new abscess; entire area mildly appropriately tender     Assessment & Plan:

## 2013-02-28 NOTE — Assessment & Plan Note (Signed)
A: BP much better today (?component of improved pain now that cyst has been drained), now on HCTZ and amlodipine.  P: Continue HCTZ and amlodipine. Pt counseled on possibility of LE swelling with amlodipine. Will address if/when if develops.

## 2013-02-28 NOTE — Patient Instructions (Signed)
Thank you for coming in, today! Your I&D is healing very nicely. Make sure you finish the doxycycline. Your last pill should be the morning of Monday 5/12. Keep the place covered as it continues to heal. You can keep the area clean with regular soap and water. Keep an eye out for the same things as before (if you have any, call or come back to see me, sooner rather than later):  Fever/chills, nausea and vomiting, abdominal pain, etc ("systemic" symptoms)  More swelling/redness around the area, with or without more tenderness.  More bleeding or drainage from the wound. Make an appointment to come back in 1-2 weeks, and we'll see if it looks like we need to remove the rest of the cyst. Please feel free to call with any questions or concerns at any time, at [phone]. --Dr. Venetia Maxon

## 2013-03-14 ENCOUNTER — Ambulatory Visit (INDEPENDENT_AMBULATORY_CARE_PROVIDER_SITE_OTHER): Payer: Self-pay | Admitting: Family Medicine

## 2013-03-14 VITALS — BP 134/82 | HR 66 | Temp 98.5°F | Ht 68.0 in | Wt 149.1 lb

## 2013-03-14 DIAGNOSIS — F17200 Nicotine dependence, unspecified, uncomplicated: Secondary | ICD-10-CM

## 2013-03-14 DIAGNOSIS — L723 Sebaceous cyst: Secondary | ICD-10-CM

## 2013-03-14 DIAGNOSIS — I10 Essential (primary) hypertension: Secondary | ICD-10-CM

## 2013-03-14 DIAGNOSIS — E7800 Pure hypercholesterolemia, unspecified: Secondary | ICD-10-CM

## 2013-03-14 MED ORDER — PRAVASTATIN SODIUM 40 MG PO TABS: 80.0000 mg | ORAL_TABLET | Freq: Every evening | ORAL | Status: DC

## 2013-03-14 NOTE — Patient Instructions (Addendum)
Thank you for coming in, today! The cyst seems to be healing well.   For now, I think we should leave it alone instead of trying to dig out pieces of it.  If it starts to come back, let me know, and we'll talk about trying to remove it. For your cholesterol:  I sent in a prescription for Pravachol (the medicine you were on before), at a higher dose than before.  Pravachol is $10 at [facility]. If you have trouble with paying for it, let me know, and we can see about medication assistance for you.  We'll plan to check your labs in a few months, if you're able.  Ask at the front for information about getting the "orange card"--this can help pay for medications, doctor's visits, and so on. For your smoking:  I'm glad you're wanting to quit! Take things one day at a time. Try smoking one fewer cigarette a day at a time.  I will talk to our "health coaches," who can call you and help with strategies for quitting.  Try calling the 1-800-QUIT-NOW number, as well.  We'll keep talking about this in the future. Otherwise keep taking your blood pressure medicines the same. Let me know if you have any problems with medications. Make an appointment to come back to see me in about 3 months. Please feel free to call with any questions or concerns at any time, at [phone]. --Dr. Venetia Maxon

## 2013-03-15 ENCOUNTER — Encounter: Payer: Self-pay | Admitting: Family Medicine

## 2013-03-15 ENCOUNTER — Telehealth: Payer: Self-pay | Admitting: Family Medicine

## 2013-03-15 DIAGNOSIS — E7800 Pure hypercholesterolemia, unspecified: Secondary | ICD-10-CM

## 2013-03-15 MED ORDER — PRAVASTATIN SODIUM 80 MG PO TABS: 80.0000 mg | ORAL_TABLET | Freq: Every evening | ORAL | Status: DC

## 2013-03-15 NOTE — Telephone Encounter (Signed)
Please call patient to tell him that I made a mistake when I ordered his pravastatin. I ordered the right dose (80 mg a day) but the wrong size tablets (I ordered 40 mg tablets, #30, but his dose should be 80 mg daily, so he only has enough for 2 weeks per refill). Please tell him I apologize for the mistake and tell him that I have sent a new prescription in to his pharmacy for 80 mg tablets. He can finish the 40 mg tablets (take two at a time, once per day), then start taking ONE tablet of the 80 mg tablets. If he has any questions or concerns, please let me know. Thanks! --CMS

## 2013-03-15 NOTE — Assessment & Plan Note (Signed)
A: Previously on Pravachol 40 mg, last lipid panel 3 years ago. Using old lipid numbers, current BP and other demographics, pt's 10-year CV event risk by [facility] web-based calculator is 24% (lifetime 70%). Given these factors and that pt was on a statin previously, per discussion with pt, would prefer to start statin therapy again even without checking current CMP and lipid panel, as was discussed previously.  P: Rx provided for Pravachol 80 mg. Will plan to f/u in 3 months and will consider labwork at that time; pt is to apply for the orange card. Pt instructed that if he cannot afford Pravachol at [facility] ($10 per month), to call back and I will give him an Rx for Lipitor 80 (which is probably more appropriate intensity but difficult to judge without current numbers) for pt to take to the MAP program. Otherwise will f/u as needed.

## 2013-03-15 NOTE — Progress Notes (Signed)
  Subjective:    Patient ID: Donald Pacheco, male    DOB: 08-26-1951, 62 y.o.   MRN: [id number]  HPI: Pt presents for f/u of recent I&D of infected sebaceous cyst over his right eye, BP, and to discuss HLD and smoking cessation.  Infected sebaceous cyst: Reports he is pleased with how the site is healing, with no further pain, redness/swelling s/p I&D and doxycycline for 10 days -abx tolerated well; no further bleeding or drainage from I&D site, which is healing well -last day site had any drainage was early last week (~7 days after I&D), and only a small amount  BP: Feels it is reasonably controlled; interested in keeping BP controlled "so I don't have a stroke" -reports compliance with amlodipine 10 and HCTZ 25 -no chest pain, headaches, N/V, SOB, LE swelling -no side effects from medications or difficulty with prescription costs  Cholesterol: Interested in starting a statin "if he needs it," but unable to afford lab testing, first; had previously discussed this with Dr. Yong Channel prior to meeting me -Pt impressed by 24% 10-year cardiovascular event risk by [facility] web-based calculator, using current BP/demographics and 81-year-old lipid numbers -Would like to start a statin despite not being able to afford labs; previously was on Pravachol and tolerated it well.  Smoking: Still smoking about 1/2 pack per day. Interested in quitting. Has not called 1-800-QUIT-NOW or heard from health coaching, yet. -reports readiness to quit is 5/10  -not a 1 because he "knows he should quit" and it would "help his blood pressure and make it less likely for him to have a stroke or heart attack"  -not a 10 because he has smoked "so long, it's a hard habit to stop," and he has several psychosocial stressors (managing his family and his deceased parents' house, etc) -not ready to set a quit date yet but interested in further counseling and 5/10 confident that he can quit "when he sets his mind to it"  In addition to  the above documentation, pt's PMH, surgical history, FH, and SH all reviewed and updated where appropriate in the EMR.   Review of Systems: As above     Objective:   Physical Exam BP 134/82  Pulse 66  Temp(Src) 98.5 F (36.9 C) (Oral)  Ht 5\' 8"  (1.727 m)  Wt 149 lb 1 oz (67.614 kg)  BMI 22.67 kg/m2 Gen: well-appearing adult male, in NAD, very pleasant HEENT: [place]/AT, EOMI, PERRLA, MMM  Small scar in right eyebrow line at site of previous I&D, well-healed without drainage, induration, redness, or tenderness  Very small <0.5 cm piece soft tissue within the brow that could represent cyst remnant and/or scar tissue from I&D Cardio: RRR, no murmur appreciated Pulm: CTAB, no wheezes, normal WOB Ext: warm, well-perfused; no cyanosis or edema; moderate clubbing of bilateral UE digits MSK: Grip strength equal bilaterally, no frank joint effusions, no muscle tenderness to extremiteis Neuro/Psych: alert/oriented, no gross focal deficits, sensation grossly intact     Assessment & Plan:

## 2013-03-15 NOTE — Assessment & Plan Note (Signed)
A: Pt interested in quitting but not ready to set a quit date. Several psychosocial stressors, currently. Pt with good response to motivational interviewing, however, and seems reasonably willing to try quitting.  P: Reinforced specific measurable goals, such as cutting back 1 cigarette per day, one day at a time. Strongly suggested calling 1-800-QUIT-NOW number. [facility] referral made, with call to patient pending. Will continue to discuss/counsel at regular follow-up.

## 2013-03-15 NOTE — Assessment & Plan Note (Addendum)
A: Site healing well with no further bleeding, drainage, or s/s of infection. Completed course of abx. Pt pleased with results of I&D.  P: Defer trying to remove cyst remnant at this point, per pt preference; I agree it would likely be difficult to remove the pieces of the cyst unless it reforms into a discreet mass. Follow up PRN. If cyst starts to reaccumulate fluid, would attempt to remove whole at first signs, rather than waiting until it becomes infected again for another I&D.

## 2013-03-15 NOTE — Assessment & Plan Note (Signed)
A: BP reasonable on HCTZ and Norvac. No new/different symptoms. No LE swelling.  P: Continue current meds/doses. Advised again on LE swelling; will follow up routinely.

## 2013-03-16 NOTE — Telephone Encounter (Signed)
LMOVM at home for patient to return my call.  Alexzandrea Normington, Loralyn Freshwater, [place]

## 2013-03-16 NOTE — Telephone Encounter (Signed)
Pt notified and verbalized understanding of Pravachol dosage.  Destenee Guerry, Loralyn Freshwater, [place]

## 2013-03-17 NOTE — Progress Notes (Signed)
Spoke with Kyung Rudd.  Pt reports that he will try to quit smoking on his own.  Informed him that if he needs support that he can make an appointment with the health coach.

## 2013-03-30 ENCOUNTER — Telehealth: Payer: Self-pay | Admitting: Family Medicine

## 2013-03-30 NOTE — Telephone Encounter (Signed)
Pt need paperwork filled out concerning being out of work for over an week for right eye being completely closed and if not filled out by doctor will be marked as unexcused absents.  Pt needs the paperwork filled out by 04/07/13.

## 2013-03-30 NOTE — Telephone Encounter (Signed)
FMLA forms placed in Dr. Ebony Hail box to complete.  Donald Pacheco

## 2013-03-31 NOTE — Telephone Encounter (Signed)
Forms completed to the best of my ability and given back to [facility]. Noted clinic number and brief message requesting to-whom-it-may-concern to call back if any clarification of treatments/dates/etc are required. Thanks! --CMS

## 2013-04-01 NOTE — Telephone Encounter (Signed)
Left message at [phone] that forms are ready to be picked up at front desk.  Lauralyn Primes

## 2013-06-10 ENCOUNTER — Ambulatory Visit (INDEPENDENT_AMBULATORY_CARE_PROVIDER_SITE_OTHER): Payer: Self-pay | Admitting: Family Medicine

## 2013-06-10 ENCOUNTER — Encounter: Payer: Self-pay | Admitting: Family Medicine

## 2013-06-10 VITALS — BP 155/88 | HR 66 | Ht 68.0 in | Wt 153.0 lb

## 2013-06-10 DIAGNOSIS — I10 Essential (primary) hypertension: Secondary | ICD-10-CM

## 2013-06-10 DIAGNOSIS — F17200 Nicotine dependence, unspecified, uncomplicated: Secondary | ICD-10-CM

## 2013-06-10 DIAGNOSIS — E7800 Pure hypercholesterolemia, unspecified: Secondary | ICD-10-CM

## 2013-06-10 MED ORDER — PRAVASTATIN SODIUM 80 MG PO TABS: 80.0000 mg | ORAL_TABLET | Freq: Every evening | ORAL | Status: DC

## 2013-06-10 MED ORDER — AMLODIPINE BESYLATE 10 MG PO TABS: 10.0000 mg | ORAL_TABLET | Freq: Every day | ORAL | Status: DC

## 2013-06-10 MED ORDER — HYDROCHLOROTHIAZIDE 25 MG PO TABS: 25.0000 mg | ORAL_TABLET | Freq: Every day | ORAL | Status: DC

## 2013-06-10 NOTE — Patient Instructions (Signed)
Thank you for coming in, today! You look well today. Your blood pressure is still high. I have sent in refills on your prescriptions. I did not change any medicines, today. If you need or want any help with smoking, please call at any time. The 1-800-QUIT-NOW hotline is absolutely free, and they can help you as well, if you like. I will give you some paperwork about the orange card. This will help in the long run if you're able to get it. Come back to see me in about 3 months. Please feel free to call with any questions or concerns at any time, at [phone]. --Dr. Venetia Maxon

## 2013-06-14 NOTE — Progress Notes (Signed)
  Subjective:    Patient ID: Donald Pacheco, male    DOB: [birth year], 62 y.o.   MRN: [id number]  HPI: Pt presents to clinic for general follow up and refills of medications. Pt states he still needs to get the orange card but denies difficulty affording meds at this time.  HTN: Pt reports compliance with medications but has been out of one (unsure if amlodipine or HCTZ) for about 1 week. -denies change in vision, headache, palpitations, jitteriness -states he forgets medications occasionally, perhaps 1-2 doses every couple of weeks  HLD: Pt also needs refills on pravachol. Denies new muscle/body aches, weakness, or pain. -typically takes medication before bed, occasionally forgets, as above  Tobacco use: Pt reports currently smoking about half a pack per day or less, down from about 1 pack per day previously -pt states he believes he can quit completely but "needs time to taper off" -declines referral to health coach -considered use of vapor/e-cig but concerned about safety  Pt is a current smoker, as above. In addition to the above documentation, pt's PMH, surgical history, FH, and SH all reviewed and updated where appropriate in the EMR. I have also reviewed and updated the pt's allergies and current medications as appropriate.  Review of Systems: As above. Does endorse a mild cold-like illness for several days, some mild cough with clear phlegm production (very little). No fever, chills, N/V, chest pain. Congestion is only a nuisance.  Otherwise, full 12-system ROS was reviewed and all negative.     Objective:   Physical Exam BP 155/88  Pulse 66  Ht 5\' 8"  (1.727 m)  Wt 153 lb (69.4 kg)  BMI 23.27 kg/m2 Gen: well-appearing adult male in NAD HEENT: [place]/AT, sclerae/conjunctivae clear, no lid lag, EOMI, PERRLA   MMM, posterior oropharynx clear, no cervical lymphadenopathy  neck supple with full ROM, no masses appreciated; thyroid not enlarged  Cardio: RRR, no murmur appreciated;  distal pulses intact/symmetric Pulm: CTAB, no wheezes, normal WOB  Abd: soft, nondistended, BS+, no HSM Ext: warm/well-perfused; moderate clubbing of fingers bilaterally, unchanged from previous MSK: strength 5/5 in all four extremities, no frank joint deformity/effusion  normal ROM to all four extremities with no point muscle/bony tenderness in spine Neuro/Psych: alert/oriented, sensation grossly intact; normal gait/balance  mood euthymic with congruent, cheerful affect     Assessment & Plan:

## 2013-06-14 NOTE — Assessment & Plan Note (Signed)
A: No myalgias or weakness on Pravachol 80. Pt still does not have the orange card, so no labs checked yet.  P: Continue Pravachol. Strongly encouraged getting orange card so pt will be able to get labs and not have to pay out of pocket. Primarily concerned with checking liver function rather than lipid panel (10-year CV event risk 24% with previous visit's BP and lipid panel numbers from 3 years ago; given age, HTN, and smoking with previous numbers, pt should be on at least moderate-intensity statin).

## 2013-06-14 NOTE — Assessment & Plan Note (Signed)
A: BP elevated today, but pt without one of his BP meds for about 1 week. Otherwise no new complaints and generally has had good control.  P: Refilled Norvasc 10, HCTZ 25. Follow up PRN.

## 2013-06-14 NOTE — Assessment & Plan Note (Signed)
A: Actively working to cut back on his own. Declines formal counseling. Discussed e-cig use, though pt is concerned with safety and lack of regulation of substances/devices, and states he thinks quitting altogether is "a better idea."  P: Continue active counseling at f/u visits. Reiterated potential use of nicotine patches/gum/etc if pt becomes interested, as well as referral to health coaching. Reiterated 1-800-QUIT-NOW number. F/u PRN.

## 2014-04-17 ENCOUNTER — Other Ambulatory Visit: Payer: Self-pay | Admitting: *Deleted

## 2014-04-17 DIAGNOSIS — I10 Essential (primary) hypertension: Secondary | ICD-10-CM

## 2014-04-17 MED ORDER — AMLODIPINE BESYLATE 10 MG PO TABS: 10.0000 mg | ORAL_TABLET | Freq: Every day | ORAL | Status: DC

## 2014-04-17 MED ORDER — HYDROCHLOROTHIAZIDE 25 MG PO TABS: 25.0000 mg | ORAL_TABLET | Freq: Every day | ORAL | Status: DC

## 2014-05-29 ENCOUNTER — Other Ambulatory Visit: Payer: Self-pay | Admitting: *Deleted

## 2014-05-29 DIAGNOSIS — E7800 Pure hypercholesterolemia, unspecified: Secondary | ICD-10-CM

## 2014-05-29 MED ORDER — PRAVASTATIN SODIUM 80 MG PO TABS: 80.0000 mg | ORAL_TABLET | Freq: Every evening | ORAL | Status: DC

## 2014-07-28 ENCOUNTER — Encounter (HOSPITAL_COMMUNITY): Payer: Self-pay | Admitting: Emergency Medicine

## 2014-07-28 ENCOUNTER — Emergency Department (INDEPENDENT_AMBULATORY_CARE_PROVIDER_SITE_OTHER)
Admission: EM | Admit: 2014-07-28 | Discharge: 2014-07-28 | Disposition: A | Discharge status: Home / Self Care | Payer: Self-pay | Source: Home / Self Care | Attending: Family Medicine | Admitting: Family Medicine

## 2014-07-28 DIAGNOSIS — K047 Periapical abscess without sinus: Secondary | ICD-10-CM

## 2014-07-28 MED ORDER — IBUPROFEN 800 MG PO TABS: 800.0000 mg | ORAL_TABLET | Freq: Three times a day (TID) | ORAL | Status: DC

## 2014-07-28 MED ORDER — CLINDAMYCIN HCL 300 MG PO CAPS: 300.0000 mg | ORAL_CAPSULE | Freq: Three times a day (TID) | ORAL | Status: DC

## 2014-07-28 NOTE — Discharge Instructions (Signed)
Take medicine as prescribed, see your dentist as soon as possible °

## 2014-07-28 NOTE — ED Provider Notes (Signed)
CSN: [id number]     Arrival date & time 07/28/14  3254 History   First MD Initiated Contact with Patient 07/28/14 [phone]     Chief Complaint  Patient presents with  . Abscess   (Consider location/radiation/quality/duration/timing/severity/associated sxs/prior Treatment) Patient is a 63 y.o. male presenting with tooth pain.  Dental Pain Location:  Upper Upper teeth location:  15/LU 2nd molar Quality:  Throbbing Severity:  No pain Onset quality:  Gradual Duration:  2 days Context: abscess and poor dentition   Associated symptoms: no fever     Past Medical History  Diagnosis Date  . HYPERCHOLESTEROLEMIA 12/24/2006  . TOBACCO DEPENDENCE 12/24/2006    40 years 1/2 PPD. 20 pack years.   Marland Kitchen HYPERTENSION, BENIGN ESSENTIAL 12/11/2009   History reviewed. No pertinent past surgical history. Family History  Problem Relation Age of Onset  . Heart failure Father     died from this age 62.   . Tuberculosis Mother     mother died when he was 4.   . Stroke Brother     27s   History  Substance Use Topics  . Smoking status: Current Every Day Smoker -- 0.30 packs/day for 40 years    Types: Cigarettes  . Smokeless tobacco: Not on file  . Alcohol Use: 1.5 oz/week    3 drink(s) per week    Review of Systems  Constitutional: Negative.  Negative for fever.  HENT: Positive for dental problem.     Allergies  Review of patient's allergies indicates no known allergies.  Home Medications   Prior to Admission medications   Medication Sig Start Date End Date Taking? Authorizing Provider  amLODipine (NORVASC) 10 MG tablet Take 1 tablet (10 mg total) by mouth daily. 04/17/14  Yes Sharon Mt Street, MD  aspirin 81 MG tablet Take 81 mg by mouth daily.   Yes Historical Provider, MD  hydrochlorothiazide (HYDRODIURIL) 25 MG tablet Take 1 tablet (25 mg total) by mouth daily. 04/17/14  Yes Sharon Mt Street, MD  pravastatin (PRAVACHOL) 80 MG tablet Take 1 tablet (80 mg total) by mouth every  evening. 05/29/14  Yes Sharon Mt Street, MD  clindamycin (CLEOCIN) 300 MG capsule Take 1 capsule (300 mg total) by mouth 3 (three) times daily. 07/28/14   Billy Fischer, MD  ibuprofen (ADVIL,MOTRIN) 800 MG tablet Take 1 tablet (800 mg total) by mouth 3 (three) times daily. 07/28/14   Billy Fischer, MD   BP 141/80  Pulse 101  Temp(Src) 98.8 F (37.1 C) (Oral)  Resp 16  SpO2 100% Physical Exam  Nursing note and vitals reviewed. Constitutional: He appears well-developed and well-nourished.  HENT:  Right Ear: External ear normal.  Left Ear: External ear normal.  Mouth/Throat: Uvula is midline and mucous membranes are normal.      ED Course  Procedures (including critical care time) Labs Review Labs Reviewed - No data to display  Imaging Review No results found.   MDM   1. Dental abscess        Billy Fischer, MD 07/28/14 1022

## 2014-07-28 NOTE — ED Notes (Signed)
Patient c/o dental abscess on the left lower side x 2 days. Patient reports he has taken Tylenol and Ibuprofen with no relief. Patient is alert and oriented and in NAD.

## 2015-01-29 ENCOUNTER — Other Ambulatory Visit: Payer: Self-pay | Admitting: *Deleted

## 2015-01-29 DIAGNOSIS — I10 Essential (primary) hypertension: Secondary | ICD-10-CM

## 2015-01-29 MED ORDER — HYDROCHLOROTHIAZIDE 25 MG PO TABS: 25.0000 mg | ORAL_TABLET | Freq: Every day | ORAL | Status: DC

## 2015-01-29 MED ORDER — AMLODIPINE BESYLATE 10 MG PO TABS: 10.0000 mg | ORAL_TABLET | Freq: Every day | ORAL | Status: DC

## 2015-03-08 ENCOUNTER — Other Ambulatory Visit: Payer: Self-pay | Admitting: *Deleted

## 2015-03-08 DIAGNOSIS — E7800 Pure hypercholesterolemia, unspecified: Secondary | ICD-10-CM

## 2015-03-08 MED ORDER — PRAVASTATIN SODIUM 80 MG PO TABS: 80.0000 mg | ORAL_TABLET | Freq: Every evening | ORAL | Status: DC

## 2015-10-01 ENCOUNTER — Other Ambulatory Visit: Payer: Self-pay | Admitting: *Deleted

## 2015-10-01 DIAGNOSIS — I10 Essential (primary) hypertension: Secondary | ICD-10-CM

## 2015-10-01 MED ORDER — AMLODIPINE BESYLATE 10 MG PO TABS: 10.0000 mg | ORAL_TABLET | Freq: Every day | ORAL | Status: DC

## 2015-10-01 MED ORDER — HYDROCHLOROTHIAZIDE 25 MG PO TABS: 25.0000 mg | ORAL_TABLET | Freq: Every day | ORAL | Status: DC

## 2015-10-01 NOTE — Telephone Encounter (Signed)
Please let the patient know I provided him with a 2 month supply of amlodipine and HCTZ. He needs to make an appt in our clinic within the next 2 months as he has not been seen in our clinic for over 2 years.  Thanks, Archie Patten, MD [facility] Resident  10/01/2015, 4:34 PM

## 2015-10-02 NOTE — Telephone Encounter (Signed)
Patient informed, will call back to schedule appointment.

## 2015-11-02 ENCOUNTER — Other Ambulatory Visit: Payer: Self-pay | Admitting: *Deleted

## 2015-11-02 DIAGNOSIS — E7800 Pure hypercholesterolemia, unspecified: Secondary | ICD-10-CM

## 2015-11-02 MED ORDER — PRAVASTATIN SODIUM 80 MG PO TABS: 80.0000 mg | ORAL_TABLET | Freq: Every evening | ORAL | Status: DC

## 2015-12-14 ENCOUNTER — Other Ambulatory Visit: Payer: Self-pay | Admitting: *Deleted

## 2015-12-14 DIAGNOSIS — I10 Essential (primary) hypertension: Secondary | ICD-10-CM

## 2015-12-16 ENCOUNTER — Telehealth: Payer: Self-pay | Admitting: Family Medicine

## 2015-12-16 MED ORDER — AMLODIPINE BESYLATE 10 MG PO TABS: 10.0000 mg | ORAL_TABLET | Freq: Every day | ORAL | Status: DC

## 2015-12-16 MED ORDER — HYDROCHLOROTHIAZIDE 25 MG PO TABS: 25.0000 mg | ORAL_TABLET | Freq: Every day | ORAL | Status: DC

## 2015-12-16 NOTE — Telephone Encounter (Signed)
Please let [name] know that I have refilled his Rx for amlodipine and HCTZ for a 1 month supply. This is absolutely the last time I will refill this medication without him coming in. He should make an appt with either myself or another provider if I am not available within the next 30 days so that we can continue to refill his medications.  Thanks, Donald Patten, MD [facility] Resident  12/16/2015, 8:27 PM

## 2015-12-17 NOTE — Telephone Encounter (Signed)
Pt informed. Deseree Blount, CMA  

## 2019-11-28 ENCOUNTER — Ambulatory Visit: Payer: Self-pay | Attending: Internal Medicine

## 2019-11-28 DIAGNOSIS — Z20822 Contact with and (suspected) exposure to covid-19: Secondary | ICD-10-CM | POA: Insufficient documentation

## 2019-11-29 LAB — NOVEL CORONAVIRUS, NAA: SARS-CoV-2, NAA: NOT DETECTED

## 2019-12-24 ENCOUNTER — Ambulatory Visit: Payer: Self-pay | Attending: Internal Medicine

## 2019-12-24 DIAGNOSIS — Z23 Encounter for immunization: Secondary | ICD-10-CM | POA: Insufficient documentation

## 2019-12-24 NOTE — Progress Notes (Signed)
   Covid-19 Vaccination Clinic  Name:  Donald Pacheco    MRN: [id number] DOB: 07-23-1951  12/24/2019  [name] was observed post Covid-19 immunization for 15 minutes without incidence. He was provided with Vaccine Information Sheet and instruction to access the V-Safe system.   [name] was instructed to call 911 with any severe reactions post vaccine: Marland Kitchen Difficulty breathing  . Swelling of your face and throat  . A fast heartbeat  . A bad rash all over your body  . Dizziness and weakness    Immunizations Administered    Name Date Dose VIS Date Route   Pfizer COVID-19 Vaccine 12/24/2019  5:28 PM 0.3 mL 10/07/2019 Intramuscular   Manufacturer: [place]   Lot: [id number]   [place]: [id number]

## 2021-04-03 ENCOUNTER — Ambulatory Visit: Payer: Self-pay | Attending: Internal Medicine

## 2021-04-03 DIAGNOSIS — Z23 Encounter for immunization: Secondary | ICD-10-CM

## 2021-04-03 NOTE — Progress Notes (Signed)
   Covid-19 Vaccination Clinic  Name:  Donald Pacheco    MRN: [id number] DOB: Dec 20, 1950  04/03/2021  Donald Pacheco was observed post Covid-19 immunization for 15 minutes without incident. He was provided with Vaccine Information Sheet and instruction to access the V-Safe system.   Donald Pacheco was instructed to call 911 with any severe reactions post vaccine: Marland Kitchen Difficulty breathing  . Swelling of face and throat  . A fast heartbeat  . A bad rash all over body  . Dizziness and weakness   Immunizations Administered    Name Date Dose VIS Date Route   PFIZER Comrnaty(Gray TOP) Covid-19 Vaccine 04/03/2021 11:54 AM 0.3 mL 10/04/2020 Intramuscular   Manufacturer: [place]   Lot: [id number]   [place]: [id number]

## 2021-04-09 ENCOUNTER — Other Ambulatory Visit (HOSPITAL_COMMUNITY): Payer: Self-pay

## 2021-04-09 MED ORDER — COVID-19 MRNA VAC-TRIS(PFIZER) 30 MCG/0.3ML IM SUSP
INTRAMUSCULAR | 0 refills | Status: DC
  Filled 2021-04-09: qty 0.3, 17d supply, fill #0

## 2021-04-10 ENCOUNTER — Other Ambulatory Visit (HOSPITAL_COMMUNITY): Payer: Self-pay

## 2021-04-18 ENCOUNTER — Other Ambulatory Visit (HOSPITAL_COMMUNITY): Payer: Self-pay

## 2021-08-21 ENCOUNTER — Other Ambulatory Visit (HOSPITAL_BASED_OUTPATIENT_CLINIC_OR_DEPARTMENT_OTHER): Payer: Self-pay

## 2021-08-21 ENCOUNTER — Ambulatory Visit: Payer: Self-pay | Attending: Internal Medicine

## 2021-08-21 DIAGNOSIS — Z23 Encounter for immunization: Secondary | ICD-10-CM

## 2021-08-21 MED ORDER — PFIZER COVID-19 VAC BIVALENT 30 MCG/0.3ML IM SUSP
INTRAMUSCULAR | 0 refills | Status: DC
  Filled 2021-08-21: qty 0.3, 1d supply, fill #0

## 2021-08-21 NOTE — Progress Notes (Signed)
   Covid-19 Vaccination Clinic  Name:  Donald Pacheco    MRN: [id number] DOB: 1950-10-28  08/21/2021  [name] was observed post Covid-19 immunization for 15 minutes without incident. He was provided with Vaccine Information Sheet and instruction to access the V-Safe system.   [name] was instructed to call 911 with any severe reactions post vaccine: Difficulty breathing  Swelling of face and throat  A fast heartbeat  A bad rash all over body  Dizziness and weakness   Immunizations Administered     Name Date Dose VIS Date Route   Pfizer Covid-19 Vaccine Bivalent Booster 08/21/2021  9:51 AM 0.3 mL 06/26/2021 Intramuscular   Manufacturer: [place]   Lot: [id number]   [place]: [phone]

## 2022-01-22 ENCOUNTER — Inpatient Hospital Stay (HOSPITAL_COMMUNITY): Payer: Medicare HMO

## 2022-01-22 ENCOUNTER — Inpatient Hospital Stay (HOSPITAL_COMMUNITY)
Admission: EM | Admit: 2022-01-22 | Discharge: 2022-01-24 | DRG: 167 | Disposition: A | Discharge status: Home / Self Care | Payer: Medicare HMO | Attending: Internal Medicine | Admitting: Internal Medicine

## 2022-01-22 ENCOUNTER — Other Ambulatory Visit: Payer: Self-pay

## 2022-01-22 ENCOUNTER — Emergency Department (HOSPITAL_COMMUNITY): Payer: Medicare HMO

## 2022-01-22 ENCOUNTER — Encounter (HOSPITAL_COMMUNITY): Payer: Self-pay

## 2022-01-22 DIAGNOSIS — I110 Hypertensive heart disease with heart failure: Secondary | ICD-10-CM | POA: Diagnosis not present

## 2022-01-22 DIAGNOSIS — F17210 Nicotine dependence, cigarettes, uncomplicated: Secondary | ICD-10-CM | POA: Diagnosis present

## 2022-01-22 DIAGNOSIS — Z716 Tobacco abuse counseling: Secondary | ICD-10-CM

## 2022-01-22 DIAGNOSIS — Z79899 Other long term (current) drug therapy: Secondary | ICD-10-CM | POA: Diagnosis not present

## 2022-01-22 DIAGNOSIS — J432 Centrilobular emphysema: Secondary | ICD-10-CM | POA: Diagnosis not present

## 2022-01-22 DIAGNOSIS — C7951 Secondary malignant neoplasm of bone: Secondary | ICD-10-CM | POA: Diagnosis not present

## 2022-01-22 DIAGNOSIS — F17200 Nicotine dependence, unspecified, uncomplicated: Secondary | ICD-10-CM | POA: Diagnosis present

## 2022-01-22 DIAGNOSIS — I10 Essential (primary) hypertension: Secondary | ICD-10-CM | POA: Diagnosis present

## 2022-01-22 DIAGNOSIS — R918 Other nonspecific abnormal finding of lung field: Secondary | ICD-10-CM | POA: Diagnosis not present

## 2022-01-22 DIAGNOSIS — I130 Hypertensive heart and chronic kidney disease with heart failure and stage 1 through stage 4 chronic kidney disease, or unspecified chronic kidney disease: Secondary | ICD-10-CM | POA: Diagnosis not present

## 2022-01-22 DIAGNOSIS — Z681 Body mass index (BMI) 19 or less, adult: Secondary | ICD-10-CM

## 2022-01-22 DIAGNOSIS — C799 Secondary malignant neoplasm of unspecified site: Secondary | ICD-10-CM | POA: Diagnosis not present

## 2022-01-22 DIAGNOSIS — N289 Disorder of kidney and ureter, unspecified: Secondary | ICD-10-CM

## 2022-01-22 DIAGNOSIS — Z7982 Long term (current) use of aspirin: Secondary | ICD-10-CM

## 2022-01-22 DIAGNOSIS — J449 Chronic obstructive pulmonary disease, unspecified: Secondary | ICD-10-CM | POA: Diagnosis not present

## 2022-01-22 DIAGNOSIS — N179 Acute kidney failure, unspecified: Secondary | ICD-10-CM | POA: Diagnosis not present

## 2022-01-22 DIAGNOSIS — M545 Low back pain, unspecified: Secondary | ICD-10-CM | POA: Diagnosis not present

## 2022-01-22 DIAGNOSIS — I5022 Chronic systolic (congestive) heart failure: Secondary | ICD-10-CM | POA: Diagnosis not present

## 2022-01-22 DIAGNOSIS — R739 Hyperglycemia, unspecified: Secondary | ICD-10-CM | POA: Diagnosis not present

## 2022-01-22 DIAGNOSIS — R0602 Shortness of breath: Secondary | ICD-10-CM | POA: Diagnosis not present

## 2022-01-22 DIAGNOSIS — N184 Chronic kidney disease, stage 4 (severe): Secondary | ICD-10-CM

## 2022-01-22 DIAGNOSIS — Z66 Do not resuscitate: Secondary | ICD-10-CM | POA: Diagnosis not present

## 2022-01-22 DIAGNOSIS — N1330 Unspecified hydronephrosis: Secondary | ICD-10-CM | POA: Diagnosis not present

## 2022-01-22 DIAGNOSIS — M549 Dorsalgia, unspecified: Secondary | ICD-10-CM | POA: Diagnosis present

## 2022-01-22 DIAGNOSIS — C7989 Secondary malignant neoplasm of other specified sites: Secondary | ICD-10-CM

## 2022-01-22 DIAGNOSIS — R9431 Abnormal electrocardiogram [ECG] [EKG]: Secondary | ICD-10-CM | POA: Diagnosis not present

## 2022-01-22 DIAGNOSIS — C3490 Malignant neoplasm of unspecified part of unspecified bronchus or lung: Secondary | ICD-10-CM | POA: Diagnosis not present

## 2022-01-22 DIAGNOSIS — E860 Dehydration: Secondary | ICD-10-CM | POA: Diagnosis not present

## 2022-01-22 DIAGNOSIS — C801 Malignant (primary) neoplasm, unspecified: Secondary | ICD-10-CM | POA: Diagnosis not present

## 2022-01-22 DIAGNOSIS — R634 Abnormal weight loss: Secondary | ICD-10-CM | POA: Diagnosis present

## 2022-01-22 DIAGNOSIS — J189 Pneumonia, unspecified organism: Secondary | ICD-10-CM | POA: Diagnosis not present

## 2022-01-22 DIAGNOSIS — C3432 Malignant neoplasm of lower lobe, left bronchus or lung: Principal | ICD-10-CM | POA: Diagnosis present

## 2022-01-22 DIAGNOSIS — J90 Pleural effusion, not elsewhere classified: Secondary | ICD-10-CM | POA: Diagnosis not present

## 2022-01-22 DIAGNOSIS — G9389 Other specified disorders of brain: Secondary | ICD-10-CM | POA: Diagnosis not present

## 2022-01-22 DIAGNOSIS — R590 Localized enlarged lymph nodes: Secondary | ICD-10-CM | POA: Diagnosis not present

## 2022-01-22 DIAGNOSIS — N281 Cyst of kidney, acquired: Secondary | ICD-10-CM | POA: Diagnosis not present

## 2022-01-22 DIAGNOSIS — E7800 Pure hypercholesterolemia, unspecified: Secondary | ICD-10-CM | POA: Diagnosis present

## 2022-01-22 DIAGNOSIS — R059 Cough, unspecified: Secondary | ICD-10-CM | POA: Diagnosis not present

## 2022-01-22 DIAGNOSIS — I2510 Atherosclerotic heart disease of native coronary artery without angina pectoris: Secondary | ICD-10-CM | POA: Diagnosis present

## 2022-01-22 DIAGNOSIS — C771 Secondary and unspecified malignant neoplasm of intrathoracic lymph nodes: Secondary | ICD-10-CM | POA: Diagnosis not present

## 2022-01-22 DIAGNOSIS — I509 Heart failure, unspecified: Secondary | ICD-10-CM | POA: Diagnosis not present

## 2022-01-22 LAB — CBC WITH DIFFERENTIAL/PLATELET
Abs Immature Granulocytes: 0.02 10*3/uL (ref 0.00–0.07)
Basophils Absolute: 0 10*3/uL (ref 0.0–0.1)
Basophils Relative: 0 %
Eosinophils Absolute: 0 10*3/uL (ref 0.0–0.5)
Eosinophils Relative: 0 %
HCT: 35.7 % — ABNORMAL LOW (ref 39.0–52.0)
Hemoglobin: 13.3 g/dL (ref 13.0–17.0)
Immature Granulocytes: 0 %
Lymphocytes Relative: 6 %
Lymphs Abs: 0.5 10*3/uL — ABNORMAL LOW (ref 0.7–4.0)
MCH: 34.8 pg — ABNORMAL HIGH (ref 26.0–34.0)
MCHC: 37.3 g/dL — ABNORMAL HIGH (ref 30.0–36.0)
MCV: 93.5 fL (ref 80.0–100.0)
Monocytes Absolute: 0.9 10*3/uL (ref 0.1–1.0)
Monocytes Relative: 10 %
Neutro Abs: 7 10*3/uL (ref 1.7–7.7)
Neutrophils Relative %: 84 %
Platelets: 285 10*3/uL (ref 150–400)
RBC: 3.82 MIL/uL — ABNORMAL LOW (ref 4.22–5.81)
RDW: 13.8 % (ref 11.5–15.5)
WBC: 8.5 10*3/uL (ref 4.0–10.5)
nRBC: 0 % (ref 0.0–0.2)

## 2022-01-22 LAB — COMPREHENSIVE METABOLIC PANEL
ALT: 11 U/L (ref 0–44)
AST: 16 U/L (ref 15–41)
Albumin: 3.7 g/dL (ref 3.5–5.0)
Alkaline Phosphatase: 281 U/L — ABNORMAL HIGH (ref 38–126)
Anion gap: 11 (ref 5–15)
BUN: 37 mg/dL — ABNORMAL HIGH (ref 8–23)
CO2: 22 mmol/L (ref 22–32)
Calcium: 9 mg/dL (ref 8.9–10.3)
Chloride: 106 mmol/L (ref 98–111)
Creatinine, Ser: 3.22 mg/dL — ABNORMAL HIGH (ref 0.61–1.24)
GFR, Estimated: 20 mL/min — ABNORMAL LOW (ref >60)
Glucose, Bld: 117 mg/dL — ABNORMAL HIGH (ref 70–99)
Potassium: 4.8 mmol/L (ref 3.5–5.1)
Sodium: 139 mmol/L (ref 135–145)
Total Bilirubin: 0.4 mg/dL (ref 0.3–1.2)
Total Protein: 7.2 g/dL (ref 6.5–8.1)

## 2022-01-22 LAB — HEMOGLOBIN A1C
Hgb A1c MFr Bld: 5 % (ref 4.8–5.6)
Mean Plasma Glucose: 96.8 mg/dL

## 2022-01-22 LAB — TSH
TSH: 0.297 u[IU]/mL — ABNORMAL LOW (ref 0.350–4.500)
TSH: 0.263 u[IU]/mL — ABNORMAL LOW (ref 0.350–4.500)

## 2022-01-22 LAB — URINALYSIS, MICROSCOPIC (REFLEX)

## 2022-01-22 LAB — HIV ANTIBODY (ROUTINE TESTING W REFLEX): HIV Screen 4th Generation wRfx: NONREACTIVE

## 2022-01-22 LAB — URINALYSIS, ROUTINE W REFLEX MICROSCOPIC
Bilirubin Urine: NEGATIVE
Glucose, UA: NEGATIVE mg/dL
Hgb urine dipstick: NEGATIVE
Ketones, ur: NEGATIVE mg/dL
Leukocytes,Ua: NEGATIVE
Nitrite: NEGATIVE
Protein, ur: 30 mg/dL — AB
Specific Gravity, Urine: 1.025 (ref 1.005–1.030)
pH: 6 (ref 5.0–8.0)

## 2022-01-22 MED ORDER — ACETAMINOPHEN 325 MG PO TABS: 650.0000 mg | ORAL_TABLET | Freq: Four times a day (QID) | ORAL | Status: DC | PRN

## 2022-01-22 MED ORDER — ALBUTEROL SULFATE (2.5 MG/3ML) 0.083% IN NEBU: 2.5000 mg | INHALATION_SOLUTION | RESPIRATORY_TRACT | Status: DC | PRN

## 2022-01-22 MED ORDER — LORAZEPAM 2 MG/ML IJ SOLN
1.0000 mg | Freq: Once | INTRAMUSCULAR | Status: AC
  Administered 2022-01-22: 1 mg via INTRAVENOUS
  Filled 2022-01-22: qty 1

## 2022-01-22 MED ORDER — ONDANSETRON HCL 4 MG/2ML IJ SOLN
4.0000 mg | Freq: Four times a day (QID) | INTRAMUSCULAR | Status: DC | PRN
  Administered 2022-01-22: 4 mg via INTRAVENOUS
  Filled 2022-01-22: qty 2

## 2022-01-22 MED ORDER — NICOTINE 14 MG/24HR TD PT24
14.0000 mg | MEDICATED_PATCH | Freq: Every day | TRANSDERMAL | Status: DC
  Administered 2022-01-23 – 2022-01-24 (×2): 14 mg via TRANSDERMAL
  Filled 2022-01-22 (×2): qty 1

## 2022-01-22 MED ORDER — LACTATED RINGERS IV SOLN: INTRAVENOUS | Status: DC

## 2022-01-22 MED ORDER — MORPHINE SULFATE (PF) 2 MG/ML IV SOLN: 2.0000 mg | INTRAVENOUS | Status: DC | PRN

## 2022-01-22 MED ORDER — POLYETHYLENE GLYCOL 3350 17 G PO PACK: 17.0000 g | PACK | Freq: Every day | ORAL | Status: DC | PRN

## 2022-01-22 MED ORDER — OXYCODONE HCL 5 MG PO TABS
5.0000 mg | ORAL_TABLET | ORAL | Status: DC | PRN
  Administered 2022-01-23 – 2022-01-24 (×2): 5 mg via ORAL
  Filled 2022-01-22 (×2): qty 1

## 2022-01-22 MED ORDER — ONDANSETRON HCL 4 MG PO TABS: 4.0000 mg | ORAL_TABLET | Freq: Four times a day (QID) | ORAL | Status: DC | PRN

## 2022-01-22 MED ORDER — ACETAMINOPHEN 650 MG RE SUPP: 650.0000 mg | Freq: Four times a day (QID) | RECTAL | Status: DC | PRN

## 2022-01-22 MED ORDER — HYDRALAZINE HCL 20 MG/ML IJ SOLN
5.0000 mg | INTRAMUSCULAR | Status: DC | PRN
  Filled 2022-01-22: qty 0.25

## 2022-01-22 MED ORDER — DOCUSATE SODIUM 100 MG PO CAPS
100.0000 mg | ORAL_CAPSULE | Freq: Two times a day (BID) | ORAL | Status: DC
  Administered 2022-01-22 – 2022-01-24 (×4): 100 mg via ORAL
  Filled 2022-01-22 (×5): qty 1

## 2022-01-22 MED ORDER — BISACODYL 5 MG PO TBEC
5.0000 mg | DELAYED_RELEASE_TABLET | Freq: Every day | ORAL | Status: DC | PRN
  Filled 2022-01-22: qty 1

## 2022-01-22 NOTE — Assessment & Plan Note (Signed)
Will check A1c

## 2022-01-22 NOTE — ED Triage Notes (Signed)
Pt. Stated, I started having lower back pain for 2 weeks. ?

## 2022-01-22 NOTE — Assessment & Plan Note (Signed)
-  Likely lung primary ?-Brain MRI pending for staging ?-For bronch tomorrow at 1230 with Dr. Tamala Julian to get tissue diagnosis ?-Patient and his wife are aware ?

## 2022-01-22 NOTE — H&P (Signed)
?History and Physical  ? ? ?Patient: Donald Pacheco:[id number] DOB: 1951/08/13 ?DOA: 01/22/2022 ?DOS: the patient was seen and examined on 01/22/2022 ?PCP: Pcp, No  ?Patient coming from: Home - lives with wife; NOK: Wife, [phone] ? ? ?Chief Complaint: Back pain ? ?HPI: Donald Pacheco is a 71 y.o. male with medical history significant of HTN, HLD, and tobacco dependence presenting with back pain x 2 weeks.  He reports that for a couple of weeks he was having back pain in his R flank.  He has also been having SOB for about the same period of time.  The pain is worse with coughing.  The cough is also new and is nonproductive.  The pain is also worse when he first gets up but eases up with movement.  SOB is worse with exertion.  +weight loss, at least 20 pounds - usual weight was maybe 165-170.  +night sweats, not new but worse in the last 2 weeks.   ? ?He last saw a doctor in maybe 2011 or 2012.  He continues to smoke, 0.5 ppd for 30+ years.   ? ? ? ?ER Course:  No doc since 2016.  Metastatic lung CA, here with back pain and weight loss.  Creatinine 3, prior normal 7 years ago.   ? ? ? ? ?Review of Systems: As mentioned in the history of present illness. All other systems reviewed and are negative. ?Past Medical History:  ?Diagnosis Date  ? HYPERCHOLESTEROLEMIA 12/24/2006  ? HYPERTENSION, BENIGN ESSENTIAL 12/11/2009  ? TOBACCO DEPENDENCE 12/24/2006  ? 40 years 1/2 PPD. 20 pack years.   ? ?History reviewed. No pertinent surgical history. ?Social History:  reports that he has been smoking cigarettes. He has a 20.00 pack-year smoking history. He does not have any smokeless tobacco history on file. He reports that he does not currently use alcohol. He reports current drug use. Drug: Marijuana. ? ?No Known Allergies ? ?Family History  ?Problem Relation Age of Onset  ? Tuberculosis Mother   ?     mother died when he was 4.   ? Heart failure Father   ?     died from this age 58.   ? Stroke Brother   ?     60s  ?  Stroke Maternal Grandmother   ? Cancer Maternal Grandmother   ?     breast cancer  ? ? ?Prior to Admission medications   ?Medication Sig Start Date End Date Taking? Authorizing Provider  ?Aspirin-Acetaminophen-Caffeine (GOODY HEADACHE PO) Take 1 packet by mouth daily.   Yes [provider]  ?Lidocaine 1.8 % PTCH Apply 1 patch topically every other day.   Yes [provider]  ?amLODipine (NORVASC) 10 MG tablet Take 1 tablet (10 mg total) by mouth daily. ?Patient not taking: Reported on 01/22/2022 12/16/15   Archie Patten, MD  ?COVID-19 mRNA bivalent vaccine, Pfizer, (PFIZER COVID-19 VAC BIVALENT) injection Inject into the muscle. 08/21/21   Carlyle Basques, MD  ?COVID-19 mRNA Vac-TriS, Pfizer, SUSP injection Inject into the muscle. 04/03/21   Carlyle Basques, MD  ?hydrochlorothiazide (HYDRODIURIL) 25 MG tablet Take 1 tablet (25 mg total) by mouth daily. ?Patient not taking: Reported on 01/22/2022 12/16/15   Archie Patten, MD  ?pravastatin (PRAVACHOL) 80 MG tablet Take 1 tablet (80 mg total) by mouth every evening. ?Patient not taking: Reported on 01/22/2022 11/02/15   Archie Patten, MD  ? ? ?Physical Exam: ?Vitals:  ? 01/22/22 1210 01/22/22 1230 01/22/22 1300 01/22/22  1400  ?BP:  (!) 152/99 (!) 143/88 (!) 160/80  ?Pulse: 82 67 (!) 54 (!) 41  ?Resp:   17 18  ?Temp:      ?TempSrc:      ?SpO2: 97% 100% 99% 100%  ? ?General:  Appears calm and comfortable and is in NAD, cachectic/frail ?Eyes:  PERRL, EOMI, normal lids, iris ?ENT:  grossly normal hearing, lips & tongue, mmm; poor dentition ?Neck:  no LAD, masses or thyromegaly ?Cardiovascular:  RRR, no m/r/g. No LE edema.  Periodic coarse but nonproductive cough ?Respiratory:   CTA bilaterally with no wheezes/rales/rhonchi.  Normal respiratory effort. ?Abdomen:  soft, NT, ND ?Skin:  no rash or induration seen on limited exam ?Musculoskeletal:  grossly normal tone BUE/BLE, good ROM, no bony abnormality ?Psychiatric:  blunted mood and affect, speech  fluent and appropriate, AOx3 ?Neurologic:  CN 2-12 grossly intact, moves all extremities in coordinated fashion ? ? ?Radiological Exams on Admission: ?Independently reviewed - see discussion in A/P where applicable ? ?DG Chest 2 View ? ?Result Date: 01/22/2022 ?CLINICAL DATA:  Shortness of breath.  Coughing. EXAM: CHEST - 2 VIEW COMPARISON:  None. FINDINGS: The cardio pericardial silhouette is enlarged. Patchy airspace disease noted at the right base with retrocardiac left base collapse/consolidation. No substantial pleural effusion. The visualized bony structures of the thorax are unremarkable. IMPRESSION: 1. Dense retrocardiac consolidative opacity, likely pneumonia. As neoplasm could have a similar appearance, close follow-up recommended and CT chest may be warranted to further evaluate depending on clinical picture. 2. Patchy subtle airspace disease at the right base. Electronically Signed   By: Misty Stanley M.D.   On: 01/22/2022 10:52  ? ?CT Chest Wo Contrast ? ?Result Date: 01/22/2022 ?CLINICAL DATA:  Pneumonia EXAM: CT CHEST WITHOUT CONTRAST TECHNIQUE: Multidetector CT imaging of the chest was performed following the standard protocol without IV contrast. RADIATION DOSE REDUCTION: This exam was performed according to the departmental dose-optimization program which includes automated exposure control, adjustment of the mA and/or kV according to patient size and/or use of iterative reconstruction technique. COMPARISON:  Chest radiographs done earlier today FINDINGS: Cardiovascular: There are scattered calcifications in the thoracic aorta. Coronary artery calcifications are seen. Heart is enlarged in size. Mediastinum/Nodes: There are enlarged lymph nodes in the mediastinum largest in the precarinal region measuring 2.1 cm in short axis. There is decreased inhomogeneous attenuation in the thyroid. Lungs/Pleura: There is extrinsic compression of left lower lobe bronchus, possibly due to enlarged lymph nodes in  the left hilum. There is large nodular infiltrate measuring approximally 6.6 x 4.8 cm in the left lower lobe. There is linear density in the lingula with 12 mm nodularity in the lateral aspect. Centrilobular emphysema is seen. In the image 31 of series 4, there is 6 mm faint ground-glass nodular density in the left upper lobe. Small patchy infiltrate is seen in the posterolateral aspect of right lower lobe. Increased interstitial markings are seen in the periphery of lower lung fields, more so in the right lower lung fields with subpleural honeycombing. In the image 78 of series 4, there is 8 mm nodule in the anterior segment of right upper lobe inseparable from minor fissure. There is possible minimal left pleural effusion. There is no pneumothorax. Upper Abdomen: There are few small calcific densities in the visualized portions of both kidneys, possibly calcifications in the renal artery branches or small renal stones. There is prominence of pelvocaliceal system in the left kidney. Musculoskeletal: There are patchy areas of sclerosis in the manubrium  sternum few right ribs and multiple thoracic and upper lumbar vertebral bodies. IMPRESSION: There is 6.6 cm nodular infiltrate in the left lower lobe. Possibility of underlying primary malignant neoplasm is not excluded. There are enlarged lymph nodes in the left hilum causing extrinsic compression of left lower lobe bronchus. Follow-up PET-CT and biopsy as warranted should be considered. There are abnormally enlarged lymph nodes in the mediastinum suggesting metastatic lymphadenopathy. There are multiple sclerotic lesions of varying sizes in the bony structures suggesting skeletal metastatic disease. There is 12 mm nodular density in the lingula which may suggest atelectasis or metastatic disease. There are few other subcentimeter nodular densities in both lungs as described in the body of the report which may be incidental granulomas or neoplastic process. COPD.  Interstitial lung disease, more prominent in the periphery of both lower lung fields. Cardiomegaly. Coronary artery calcifications are seen. Other findings as described in the body of the report. Electronically Sig

## 2022-01-22 NOTE — Assessment & Plan Note (Signed)
-  Previously took Norvasc and HCTZ, not on meds in over a decade ?-He appears likely to need resumption of medications ?-Will cover with PRN IV hydralazine for now ?

## 2022-01-22 NOTE — ED Notes (Signed)
Pt care taken, is resting watching tv no complaints at this time ?

## 2022-01-22 NOTE — ED Notes (Signed)
Patient transported to X-ray 

## 2022-01-22 NOTE — Assessment & Plan Note (Signed)
-  There may be an acute component, but this patient with known HTN has not taken medications in a decade so CKD appears more likely ?-Renal US ordered ?-Will give gentle IVF hydration ?-Repeat BMP in AM ?

## 2022-01-22 NOTE — ED Notes (Signed)
Patient transported to MRI 

## 2022-01-22 NOTE — ED Notes (Signed)
Family in room with Pt. ?

## 2022-01-22 NOTE — Assessment & Plan Note (Signed)
-  Previously on Pravachol, no current meds ?-Needs outpatient f/u about this issue ?

## 2022-01-22 NOTE — Assessment & Plan Note (Signed)
-  I have discussed code status with the patient and his wife and  they are in agreement that the patient would not desire resuscitation and would prefer to die a natural death should that situation arise. ?-He will need a gold out of facility DNR form at the time of discharge ?

## 2022-01-22 NOTE — ED Provider Triage Note (Signed)
Emergency Medicine Provider Triage Evaluation Note ? ?Donald Pacheco , a 71 y.o. male  was evaluated in triage.  Pt complains of lower back pain for 2 weeks.  His symptoms are made worse with laying down, improved with activity/motion.  He intermittently has some pain/numbness radiating to his right leg. ? ?Review of Systems  ?Positive: Back pain, intermittent numbness ?Negative: Fever, chills, abdominal pain, urinary symptoms ? ?Physical Exam  ?BP (!) 139/92   Pulse 73   Temp (!) 97.5 ?F (36.4 ?C) (Oral)   Resp 14   SpO2 100%  ?Gen:   Awake, no distress   ?Resp:  Normal effort  ?MSK:   Moves extremities without difficulty  ?Other:   ? ?Medical Decision Making  ?Medically screening exam initiated at 9:36 AM.  Appropriate orders placed.  Donald Pacheco was informed that the remainder of the evaluation will be completed by another provider, this initial triage assessment does not replace that evaluation, and the importance of remaining in the ED until their evaluation is complete. ? ? ?  ?Kateri Plummer, PA-C ?01/22/22 0940 ? ?

## 2022-01-22 NOTE — Assessment & Plan Note (Signed)
-  Encourage cessation.   ?-This was discussed with the patient and should be reviewed on an ongoing basis.   ?-Patch ordered  ?

## 2022-01-22 NOTE — ED Provider Notes (Signed)
?[facility] ?Provider Note ? ? ?CSN: [id number] ?Arrival date & time: 01/22/22  0909 ? ?  ? ?History ? ?Chief Complaint  ?Patient presents with  ? Back Pain  ? ? ?Donald Pacheco is a 71 y.o. male. ? ? ?Back Pain ?Associated symptoms: weakness   ?Patient presents with fatigue and low back pain.  Has been lasting for couple weeks.  Worse in the right lower back.  Worse with certain movements.  Does have weakness in the right leg.  States he also been feeling weak all over.  Decreased energy.  Has had some weight loss.  Occasional cough without real sputum production.  Is a smoker.  No abdominal pain.  Has not really seen a doctor in several years. ?  ? ?Home Medications ?Prior to Admission medications   ?Medication Sig Start Date End Date Taking? Authorizing Provider  ?Aspirin-Acetaminophen-Caffeine (GOODY HEADACHE PO) Take 1 packet by mouth daily.   Yes [provider]  ?Lidocaine 1.8 % PTCH Apply 1 patch topically every other day.   Yes [provider]  ?   ? ?Allergies    ?Patient has no known allergies.   ? ?Review of Systems   ?Review of Systems  ?Constitutional:  Positive for appetite change, fatigue and unexpected weight change.  ?Respiratory:  Positive for choking.   ?Musculoskeletal:  Positive for back pain.  ?Neurological:  Positive for weakness.  ? ?Physical Exam ?Updated Vital Signs ?BP (!) 159/89   Pulse (!) 1   Temp (!) 97.5 ?F (36.4 ?C) (Oral)   Resp 16   SpO2 98%  ?Physical Exam ?Vitals and nursing note reviewed.  ?Constitutional:   ?   Comments: Somewhat cachectic.  ?Eyes:  ?   Pupils: Pupils are equal, round, and reactive to light.  ?Chest:  ?   Chest wall: No tenderness.  ?Abdominal:  ?   Tenderness: There is no abdominal tenderness.  ?Musculoskeletal:     ?   General: Tenderness present.  ?   Cervical back: Neck supple.  ?   Comments: Right-sided low back tenderness  ?Skin: ?   Capillary Refill: Capillary refill takes less than 2  seconds.  ?Neurological:  ?   Mental Status: He is alert and oriented to person, place, and time.  ? ? ?ED Results / Procedures / Treatments   ?Labs ?(all labs ordered are listed, but only abnormal results are displayed) ?Labs Reviewed  ?COMPREHENSIVE METABOLIC PANEL - Abnormal; Notable for the following components:  ?    Result Value  ? Glucose, Bld 117 (*)   ? BUN 37 (*)   ? Creatinine, Ser 3.22 (*)   ? Alkaline Phosphatase 281 (*)   ? GFR, Estimated 20 (*)   ? All other components within normal limits  ?CBC WITH DIFFERENTIAL/PLATELET - Abnormal; Notable for the following components:  ? RBC 3.82 (*)   ? HCT 35.7 (*)   ? MCH 34.8 (*)   ? MCHC 37.3 (*)   ? Lymphs Abs 0.5 (*)   ? All other components within normal limits  ?TSH - Abnormal; Notable for the following components:  ? TSH 0.297 (*)   ? All other components within normal limits  ?URINALYSIS, ROUTINE W REFLEX MICROSCOPIC - Abnormal; Notable for the following components:  ? Protein, ur 30 (*)   ? All other components within normal limits  ?TSH - Abnormal; Notable for the following components:  ? TSH 0.263 (*)   ? All other components  within normal limits  ?URINALYSIS, MICROSCOPIC (REFLEX) - Abnormal; Notable for the following components:  ? Bacteria, UA RARE (*)   ? All other components within normal limits  ?HIV ANTIBODY (ROUTINE TESTING W REFLEX)  ?HEMOGLOBIN Y6A  ?BASIC METABOLIC PANEL  ?CBC  ? ? ?EKG ?None ? ?Radiology ?DG Chest 2 View ? ?Result Date: 01/22/2022 ?CLINICAL DATA:  Shortness of breath.  Coughing. EXAM: CHEST - 2 VIEW COMPARISON:  None. FINDINGS: The cardio pericardial silhouette is enlarged. Patchy airspace disease noted at the right base with retrocardiac left base collapse/consolidation. No substantial pleural effusion. The visualized bony structures of the thorax are unremarkable. IMPRESSION: 1. Dense retrocardiac consolidative opacity, likely pneumonia. As neoplasm could have a similar appearance, close follow-up recommended and CT chest  may be warranted to further evaluate depending on clinical picture. 2. Patchy subtle airspace disease at the right base. Electronically Signed   By: Misty Stanley M.D.   On: 01/22/2022 10:52  ? ?CT Chest Wo Contrast ? ?Result Date: 01/22/2022 ?CLINICAL DATA:  Pneumonia EXAM: CT CHEST WITHOUT CONTRAST TECHNIQUE: Multidetector CT imaging of the chest was performed following the standard protocol without IV contrast. RADIATION DOSE REDUCTION: This exam was performed according to the departmental dose-optimization program which includes automated exposure control, adjustment of the mA and/or kV according to patient size and/or use of iterative reconstruction technique. COMPARISON:  Chest radiographs done earlier today FINDINGS: Cardiovascular: There are scattered calcifications in the thoracic aorta. Coronary artery calcifications are seen. Heart is enlarged in size. Mediastinum/Nodes: There are enlarged lymph nodes in the mediastinum largest in the precarinal region measuring 2.1 cm in short axis. There is decreased inhomogeneous attenuation in the thyroid. Lungs/Pleura: There is extrinsic compression of left lower lobe bronchus, possibly due to enlarged lymph nodes in the left hilum. There is large nodular infiltrate measuring approximally 6.6 x 4.8 cm in the left lower lobe. There is linear density in the lingula with 12 mm nodularity in the lateral aspect. Centrilobular emphysema is seen. In the image 31 of series 4, there is 6 mm faint ground-glass nodular density in the left upper lobe. Small patchy infiltrate is seen in the posterolateral aspect of right lower lobe. Increased interstitial markings are seen in the periphery of lower lung fields, more so in the right lower lung fields with subpleural honeycombing. In the image 78 of series 4, there is 8 mm nodule in the anterior segment of right upper lobe inseparable from minor fissure. There is possible minimal left pleural effusion. There is no pneumothorax.  Upper Abdomen: There are few small calcific densities in the visualized portions of both kidneys, possibly calcifications in the renal artery branches or small renal stones. There is prominence of pelvocaliceal system in the left kidney. Musculoskeletal: There are patchy areas of sclerosis in the manubrium sternum few right ribs and multiple thoracic and upper lumbar vertebral bodies. IMPRESSION: There is 6.6 cm nodular infiltrate in the left lower lobe. Possibility of underlying primary malignant neoplasm is not excluded. There are enlarged lymph nodes in the left hilum causing extrinsic compression of left lower lobe bronchus. Follow-up PET-CT and biopsy as warranted should be considered. There are abnormally enlarged lymph nodes in the mediastinum suggesting metastatic lymphadenopathy. There are multiple sclerotic lesions of varying sizes in the bony structures suggesting skeletal metastatic disease. There is 12 mm nodular density in the lingula which may suggest atelectasis or metastatic disease. There are few other subcentimeter nodular densities in both lungs as described in the body of  the report which may be incidental granulomas or neoplastic process. COPD. Interstitial lung disease, more prominent in the periphery of both lower lung fields. Cardiomegaly. Coronary artery calcifications are seen. Other findings as described in the body of the report. Electronically Signed   By: Elmer Picker M.D.   On: 01/22/2022 12:22  ? ?MR BRAIN WO CONTRAST ? ?Result Date: 01/22/2022 ?CLINICAL DATA:  Non-small cell lung cancer staging. EXAM: MRI HEAD WITHOUT CONTRAST TECHNIQUE: Multiplanar, multiecho pulse sequences of the brain and surrounding structures were obtained without intravenous contrast. COMPARISON:  None. FINDINGS: Brain: There is no evidence of an acute infarct, intracranial hemorrhage, midline shift, or extra-axial fluid collection. Patchy T2 hyperintensities in the cerebral white matter, deep gray  nuclei, and pons are nonspecific but compatible with moderate chronic small vessel ischemic disease. There is mild generalized cerebral atrophy. No mass is identified on this unenhanced study. Vascular: Major intracrani

## 2022-01-22 NOTE — Consult Note (Signed)
? ?NAME:  Donald Pacheco, MRN:  [id number], DOB:  Dec 27, 1950, LOS: 0 ?ADMISSION DATE:  01/22/2022, CONSULTATION DATE:  3/29 ?REFERRING MD:  Alvino Chapel, REASON FOR CONSULT: Lung Mass  ? ?History of Present Illness:  ? ?Donald Pacheco, is a 71 y.o. male, who presented to the [facility] ED with a chief complaint of two weeks of SOB and back pain ? ?They have a pertinent past medical history of active smoking, 1/2 PPD for 30 years. Denies taking any medications. Last states he saw a physician in 2011-2012. 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 Reports 20 pound weight loss. ? ?ED course was notable for a CXR with a suspicious retrocardiac opacity.  CT chest was obtained, concerning for a 6.6 cm nodular infiltrate in the left lower lobe with compression of the bronchus and enlarged lymph nodes of the left hilum and a 12 mm nodular density in the lingula. ? ?PCCM was consulted for evaluation ? ?Pertinent  Medical History  ?Active smoker, 1/2 PPD for 30 years, HTN, HLD ? ?Significant Hospital Events: ?Including procedures, antibiotic start and stop dates in addition to other pertinent events   ?3/29 Admit, CT chest with X2 nodules ? ?Interim History / Subjective:  ?See above ? ?On room air ? ?Denies chest pain, endorses some SOB, endorses some back pain ? ?Objective   ?Blood pressure (!) 143/88, pulse (!) 54, temperature (!) 97.5 ?F (36.4 ?C), temperature source Oral, resp. rate 17, SpO2 99 %. ?   ?   ?No intake or output data in the 24 hours ending 01/22/22 1413 ?There were no vitals filed for this visit. ? ?Examination: ?General: In bed, NAD, appears comfortable, thin ?HEENT: MM pink/moist, anicteric, atraumatic ?Neuro: RASS 0, PERRL 70mm, GCS 15 ?CV: S1S2, RRR, no m/r/g appreciated ?PULM:  Air movement noted in all lobes, trachea midline, chest expansion symmetric ?GI: soft, bsx4 active, non-tender   ?Extremities: warm/dry, no pretibial edema, capillary refill less than 3 seconds,  clubbing on fingers  ?Skin:  no rashes or lesions noted ? ?Labs: ?Creatinine 3.22 ? ?Resolved Hospital Problem list   ? ? ?Assessment & Plan:  ?6.6 mm left lower lobe nodule of the lung ?12 mm nodule of the lingula ?Active smoker ?As seen on 3/29 CT chest. 30 year 1/2 PPD smoking HX, recently lost weight without trying, reported 20 pound loss, increasing shortness of breath and back pain over the past 2 weeks.  Dr. Tamala Julian discussed with patient and spouse that presentation and imaging concerning for cancer. ?-Plan for diagnostic bronchoscopy with tissue sampling on 3/30 in endoscopy. ?-N.p.o. at midnight ?-Obtain brain MRI ?-Work-up of back pain and pain control per primary ?-PCCM will continue to follow along and will have further recommendations ?-Smoking cessation education when appropriate ? ?All other issues per primary ? ?Best Practice (right click and "Reselect all SmartList Selections" daily)  ? ?Per primary ? ?Labs   ?CBC: ?Recent Labs  ?Lab 01/22/22 ?1111  ?WBC 8.5  ?NEUTROABS 7.0  ?HGB 13.3  ?HCT 35.7*  ?MCV 93.5  ?PLT 285  ? ? ?Basic Metabolic Panel: ?Recent Labs  ?Lab 01/22/22 ?1111  ?NA 139  ?K 4.8  ?CL 106  ?CO2 22  ?GLUCOSE 117*  ?BUN 37*  ?CREATININE 3.22*  ?CALCIUM 9.0  ? ?GFR: ?CrCl cannot be calculated (Unknown ideal weight.). ?Recent Labs  ?Lab 01/22/22 ?1111  ?WBC 8.5  ? ? ?Liver Function Tests: ?Recent Labs  ?Lab 01/22/22 ?1111  ?AST 16  ?  ALT 11  ?ALKPHOS 281*  ?BILITOT 0.4  ?PROT 7.2  ?ALBUMIN 3.7  ? ?No results for input(s): LIPASE, AMYLASE in the last 168 hours. ?No results for input(s): AMMONIA in the last 168 hours. ? ?ABG ?No results found for: PHART, PCO2ART, PO2ART, HCO3, TCO2, ACIDBASEDEF, O2SAT  ? ?Coagulation Profile: ?No results for input(s): INR, PROTIME in the last 168 hours. ? ?Cardiac Enzymes: ?No results for input(s): CKTOTAL, CKMB, CKMBINDEX, TROPONINI in the last 168 hours. ? ?HbA1C: ?No results found for: HGBA1C ? ?CBG: ?No results for input(s): GLUCAP in the last 168  hours. ? ?Review of Systems:   ?Positives in bold ? ?Gen: fever, chills, weight change, fatigue, night sweats ?HEENT:  blurred vision, double vision, hearing loss, tinnitus, sinus congestion, rhinorrhea, sore throat, neck stiffness, dysphagia ?PULM:  shortness of breath, cough, sputum production, hemoptysis, wheezing ?CV: chest pain, edema, orthopnea, paroxysmal nocturnal dyspnea, palpitations ?GI:  abdominal pain, nausea, vomiting, diarrhea, hematochezia, melena, constipation, change in bowel habits ?GU: dysuria, hematuria, polyuria, oliguria, urethral discharge ?Endocrine: hot or cold intolerance, polyuria, polyphagia or appetite change ?Derm: rash, dry skin, scaling or peeling skin change ?Heme: easy bruising, bleeding, bleeding gums ?Neuro: headache, numbness, weakness, slurred speech, loss of memory or consciousness, back pain ? ? ?Past Medical History:  ?He,  has a past medical history of HYPERCHOLESTEROLEMIA (12/24/2006), HYPERTENSION, BENIGN ESSENTIAL (12/11/2009), and TOBACCO DEPENDENCE (12/24/2006).  ? ?Surgical History:  ?History reviewed. No pertinent surgical history.  ? ?Social History:  ? reports that he has been smoking cigarettes. He has a 20.00 pack-year smoking history. He does not have any smokeless tobacco history on file. He reports that he does not currently use alcohol. He reports current drug use. Drug: Marijuana.  ? ?Family History:  ?His family history includes Cancer in his maternal grandmother; Heart failure in his father; Stroke in his brother and maternal grandmother; Tuberculosis in his mother.  ? ?Allergies ?No Known Allergies  ? ?Home Medications  ?Prior to Admission medications   ?Medication Sig Start Date End Date Taking? Authorizing Provider  ?Aspirin-Acetaminophen-Caffeine (GOODY HEADACHE PO) Take 1 packet by mouth daily.   Yes [provider]  ?Lidocaine 1.8 % PTCH Apply 1 patch topically every other day.   Yes [provider]  ?amLODipine (NORVASC) 10 MG tablet  Take 1 tablet (10 mg total) by mouth daily. ?Patient not taking: Reported on 01/22/2022 12/16/15   Archie Patten, MD  ?COVID-19 mRNA bivalent vaccine, Pfizer, (PFIZER COVID-19 VAC BIVALENT) injection Inject into the muscle. 08/21/21   Carlyle Basques, MD  ?COVID-19 mRNA Vac-TriS, Pfizer, SUSP injection Inject into the muscle. 04/03/21   Carlyle Basques, MD  ?hydrochlorothiazide (HYDRODIURIL) 25 MG tablet Take 1 tablet (25 mg total) by mouth daily. ?Patient not taking: Reported on 01/22/2022 12/16/15   Archie Patten, MD  ?pravastatin (PRAVACHOL) 80 MG tablet Take 1 tablet (80 mg total) by mouth every evening. ?Patient not taking: Reported on 01/22/2022 11/02/15   Archie Patten, MD  ?  ? ?Critical care time: N/A ?  ? ? ?[employer]., MSN, APRN, AGACNP-BC ?[facility]  ?01/22/2022 , 2:31 PM ? ?Please see [email or web address] for pager details ? ?If no response, please call [phone] ?After hours, please call Elink at [phone] ? ? ? ?

## 2022-01-22 NOTE — Assessment & Plan Note (Signed)
-  Patient presenting with back pain ?-Has not seen a doctor in many years ?-Imaging appears to indicate metastatic disease ?-Will admit for pain control and further evaluation ?

## 2022-01-23 ENCOUNTER — Inpatient Hospital Stay (HOSPITAL_COMMUNITY): Payer: Medicare HMO | Admitting: Certified Registered Nurse Anesthetist

## 2022-01-23 ENCOUNTER — Encounter (HOSPITAL_COMMUNITY)
Admission: EM | Disposition: A | Discharge status: Home / Self Care | Payer: Self-pay | Source: Home / Self Care | Attending: Internal Medicine

## 2022-01-23 ENCOUNTER — Inpatient Hospital Stay (HOSPITAL_COMMUNITY): Payer: Medicare HMO

## 2022-01-23 ENCOUNTER — Encounter (HOSPITAL_COMMUNITY): Payer: Self-pay | Admitting: Internal Medicine

## 2022-01-23 ENCOUNTER — Telehealth: Payer: Self-pay | Admitting: Internal Medicine

## 2022-01-23 DIAGNOSIS — R9431 Abnormal electrocardiogram [ECG] [EKG]: Secondary | ICD-10-CM | POA: Diagnosis not present

## 2022-01-23 DIAGNOSIS — I509 Heart failure, unspecified: Secondary | ICD-10-CM

## 2022-01-23 DIAGNOSIS — R918 Other nonspecific abnormal finding of lung field: Secondary | ICD-10-CM

## 2022-01-23 DIAGNOSIS — M545 Low back pain, unspecified: Secondary | ICD-10-CM | POA: Diagnosis not present

## 2022-01-23 DIAGNOSIS — C799 Secondary malignant neoplasm of unspecified site: Secondary | ICD-10-CM | POA: Diagnosis not present

## 2022-01-23 DIAGNOSIS — R590 Localized enlarged lymph nodes: Secondary | ICD-10-CM

## 2022-01-23 DIAGNOSIS — I110 Hypertensive heart disease with heart failure: Secondary | ICD-10-CM

## 2022-01-23 HISTORY — PX: FINE NEEDLE ASPIRATION: SHX5430

## 2022-01-23 HISTORY — PX: VIDEO BRONCHOSCOPY WITH ENDOBRONCHIAL ULTRASOUND: SHX6177

## 2022-01-23 LAB — ECHOCARDIOGRAM COMPLETE
Height: 68 in
S' Lateral: 4 cm
Single Plane A2C EF: 37.1 %
Weight: 2091.72 oz

## 2022-01-23 LAB — BASIC METABOLIC PANEL
Anion gap: 8 (ref 5–15)
BUN: 30 mg/dL — ABNORMAL HIGH (ref 8–23)
CO2: 25 mmol/L (ref 22–32)
Calcium: 8.6 mg/dL — ABNORMAL LOW (ref 8.9–10.3)
Chloride: 108 mmol/L (ref 98–111)
Creatinine, Ser: 2.63 mg/dL — ABNORMAL HIGH (ref 0.61–1.24)
GFR, Estimated: 26 mL/min — ABNORMAL LOW (ref >60)
Glucose, Bld: 89 mg/dL (ref 70–99)
Potassium: 4.2 mmol/L (ref 3.5–5.1)
Sodium: 141 mmol/L (ref 135–145)

## 2022-01-23 LAB — CBC
HCT: 30.7 % — ABNORMAL LOW (ref 39.0–52.0)
Hemoglobin: 11.5 g/dL — ABNORMAL LOW (ref 13.0–17.0)
MCH: 34.4 pg — ABNORMAL HIGH (ref 26.0–34.0)
MCHC: 37.5 g/dL — ABNORMAL HIGH (ref 30.0–36.0)
MCV: 91.9 fL (ref 80.0–100.0)
Platelets: 275 10*3/uL (ref 150–400)
RBC: 3.34 MIL/uL — ABNORMAL LOW (ref 4.22–5.81)
RDW: 13.8 % (ref 11.5–15.5)
WBC: 6.5 10*3/uL (ref 4.0–10.5)
nRBC: 0 % (ref 0.0–0.2)

## 2022-01-23 SURGERY — BRONCHOSCOPY, WITH EBUS
Anesthesia: General | Laterality: Right

## 2022-01-23 MED ORDER — MIDAZOLAM HCL 2 MG/2ML IJ SOLN
INTRAMUSCULAR | Status: DC | PRN
Start: 2022-01-23 — End: 2022-01-23
  Administered 2022-01-23: 2 mg via INTRAVENOUS

## 2022-01-23 MED ORDER — SUGAMMADEX SODIUM 200 MG/2ML IV SOLN
INTRAVENOUS | Status: DC | PRN
Start: 2022-01-23 — End: 2022-01-23
  Administered 2022-01-23: 300 mg via INTRAVENOUS

## 2022-01-23 MED ORDER — DEXAMETHASONE SODIUM PHOSPHATE 10 MG/ML IJ SOLN
INTRAMUSCULAR | Status: DC | PRN
  Administered 2022-01-23: 5 mg via INTRAVENOUS

## 2022-01-23 MED ORDER — ROCURONIUM BROMIDE 10 MG/ML (PF) SYRINGE
PREFILLED_SYRINGE | INTRAVENOUS | Status: DC | PRN
  Administered 2022-01-23: 70 mg via INTRAVENOUS

## 2022-01-23 MED ORDER — ADULT MULTIVITAMIN W/MINERALS CH
1.0000 | ORAL_TABLET | Freq: Every day | ORAL | Status: DC
  Administered 2022-01-24: 1 via ORAL
  Filled 2022-01-23: qty 1

## 2022-01-23 MED ORDER — LIDOCAINE 2% (20 MG/ML) 5 ML SYRINGE
INTRAMUSCULAR | Status: DC | PRN
  Administered 2022-01-23: 60 mg via INTRAVENOUS

## 2022-01-23 MED ORDER — SUGAMMADEX SODIUM 200 MG/2ML IV SOLN: INTRAVENOUS | Status: DC | PRN

## 2022-01-23 MED ORDER — MIDAZOLAM HCL 2 MG/2ML IJ SOLN
INTRAMUSCULAR | Status: AC
  Filled 2022-01-23: qty 2

## 2022-01-23 MED ORDER — PHENYLEPHRINE 40 MCG/ML (10ML) SYRINGE FOR IV PUSH (FOR BLOOD PRESSURE SUPPORT)
PREFILLED_SYRINGE | INTRAVENOUS | Status: DC | PRN
  Administered 2022-01-23: 80 ug via INTRAVENOUS
  Administered 2022-01-23: 120 ug via INTRAVENOUS
  Administered 2022-01-23 (×3): 80 ug via INTRAVENOUS

## 2022-01-23 MED ORDER — ONDANSETRON HCL 4 MG/2ML IJ SOLN
INTRAMUSCULAR | Status: DC | PRN
  Administered 2022-01-23: 4 mg via INTRAVENOUS

## 2022-01-23 MED ORDER — ENSURE ENLIVE PO LIQD: 237.0000 mL | Freq: Three times a day (TID) | ORAL | Status: DC

## 2022-01-23 MED ORDER — FENTANYL CITRATE (PF) 100 MCG/2ML IJ SOLN
INTRAMUSCULAR | Status: DC | PRN
Start: 2022-01-23 — End: 2022-01-23
  Administered 2022-01-23: 75 ug via INTRAVENOUS

## 2022-01-23 MED ORDER — FENTANYL CITRATE (PF) 100 MCG/2ML IJ SOLN
INTRAMUSCULAR | Status: AC
  Filled 2022-01-23: qty 2

## 2022-01-23 MED ORDER — PROPOFOL 10 MG/ML IV BOLUS
INTRAVENOUS | Status: DC | PRN
  Administered 2022-01-23: 120 mg via INTRAVENOUS

## 2022-01-23 SURGICAL SUPPLY — 35 items
ADAPTER VALVE BIOPSY EBUS (MISCELLANEOUS) IMPLANT
ADPTR VALVE BIOPSY EBUS (MISCELLANEOUS)
BRUSH CYTOL CELLEBRITY 1.5X140 (MISCELLANEOUS) IMPLANT
CANISTER SUCT 3000ML PPV (MISCELLANEOUS) ×3 IMPLANT
CONT SPEC 4OZ CLIKSEAL STRL BL (MISCELLANEOUS) ×3 IMPLANT
COVER BACK TABLE 60X90IN (DRAPES) ×3 IMPLANT
FORCEPS BIOP RJ4 1.8 (CUTTING FORCEPS) IMPLANT
GAUZE SPONGE 4X4 12PLY STRL (GAUZE/BANDAGES/DRESSINGS) ×3 IMPLANT
GLOVE BIO SURGEON STRL SZ7.5 (GLOVE) ×3 IMPLANT
GOWN STRL REUS W/ TWL LRG LVL3 (GOWN DISPOSABLE) ×2 IMPLANT
GOWN STRL REUS W/ TWL XL LVL3 (GOWN DISPOSABLE) ×2 IMPLANT
GOWN STRL REUS W/TWL LRG LVL3 (GOWN DISPOSABLE) ×3
GOWN STRL REUS W/TWL XL LVL3 (GOWN DISPOSABLE) ×3
KIT CLEAN ENDO COMPLIANCE (KITS) ×6 IMPLANT
KIT TURNOVER KIT B (KITS) ×3 IMPLANT
MARKER SKIN DUAL TIP RULER LAB (MISCELLANEOUS) ×3 IMPLANT
NDL ASPIRATION VIZISHOT 19G (NEEDLE) IMPLANT
NDL ASPIRATION VIZISHOT 21G (NEEDLE) IMPLANT
NEEDLE ASPIRATION VIZISHOT 19G (NEEDLE) IMPLANT
NEEDLE ASPIRATION VIZISHOT 21G (NEEDLE) IMPLANT
NS IRRIG 1000ML POUR BTL (IV SOLUTION) ×3 IMPLANT
OIL SILICONE PENTAX (PARTS (SERVICE/REPAIRS)) ×3 IMPLANT
PAD ARMBOARD 7.5X6 YLW CONV (MISCELLANEOUS) ×6 IMPLANT
SYR 20ML ECCENTRIC (SYRINGE) ×6 IMPLANT
SYR 20ML LL LF (SYRINGE) ×6 IMPLANT
SYR 50ML SLIP (SYRINGE) IMPLANT
SYR 5ML LUER SLIP (SYRINGE) ×3 IMPLANT
TOWEL GREEN STERILE FF (TOWEL DISPOSABLE) ×3 IMPLANT
TRAP SPECIMEN MUCOUS 40CC (MISCELLANEOUS) IMPLANT
TUBE CONNECTING 20X1/4 (TUBING) ×6 IMPLANT
UNDERPAD 30X30 (UNDERPADS AND DIAPERS) ×3 IMPLANT
VALVE BIOPSY  SINGLE USE (MISCELLANEOUS) ×3
VALVE BIOPSY SINGLE USE (MISCELLANEOUS) ×2 IMPLANT
VALVE SUCTION BRONCHIO DISP (MISCELLANEOUS) ×3 IMPLANT
WATER STERILE IRR 1000ML POUR (IV SOLUTION) ×3 IMPLANT

## 2022-01-23 NOTE — Anesthesia Procedure Notes (Signed)
Procedure Name: Intubation ?Date/Time: 01/23/2022 10:27 AM ?Performed by: Oleta Mouse, MD ?Pre-anesthesia Checklist: Patient identified, Emergency Drugs available, Suction available and Patient being monitored ?Patient Re-evaluated:Patient Re-evaluated prior to induction ?Oxygen Delivery Method: Circle System Utilized ?Preoxygenation: Pre-oxygenation with 100% oxygen ?Induction Type: IV induction ?Ventilation: Mask ventilation without difficulty ?Laryngoscope Size: Mac and 3 ?Grade View: Grade I ?Tube type: Oral ?Tube size: 8.5 mm ?Number of attempts: 1 ?Airway Equipment and Method: Stylet and Oral airway ?Placement Confirmation: ETT inserted through vocal cords under direct vision, positive ETCO2 and breath sounds checked- equal and bilateral ?Secured at: 22 cm ?Tube secured with: Tape ?Dental Injury: Teeth and Oropharynx as per pre-operative assessment  ? ? ? ? ?

## 2022-01-23 NOTE — Evaluation (Signed)
Physical Therapy Evaluation & Discharge ?Patient Details ?Name: Donald Pacheco ?MRN: [id number] ?DOB: May 05, 1951 ?Today's Date: 01/23/2022 ? ?History of Present Illness ? 71 y/o male presented to ED on 01/22/22 for back pain x 2 weeks. MRI showed suspected bone metastasis in C3 vertebral body. CXR concerned for pneumonia. Imaging also indicating metastatic disease. PMH: hx of metastatic lung cancer, HTN, tobacco dependence  ?Clinical Impression ? Patient admitted with the above. Patient functioning at independent level with no AD. Patient does report R LE fatigue with prolonged ambulation. Patient is at baseline functioning. No further skilled PT needs identified acutely. No PT follow up recommended at this time. PT will sign off.  ?   ? ?Recommendations for follow up therapy are one component of a multi-disciplinary discharge planning process, led by the attending physician.  Recommendations may be updated based on patient status, additional functional criteria and insurance authorization. ? ?Follow Up Recommendations No PT follow up ? ?  ?Assistance Recommended at Discharge PRN  ?Patient can return home with the following ?   ? ?  ?Equipment Recommendations None recommended by PT  ?Recommendations for Other Services ?    ?  ?Functional Status Assessment Patient has not had a recent decline in their functional status  ? ?  ?Precautions / Restrictions Precautions ?Precautions: None ?Restrictions ?Weight Bearing Restrictions: No  ? ?  ? ?Mobility ? Bed Mobility ?Overal bed mobility: Independent ?  ?  ?  ?  ?  ?  ?  ?  ? ?Transfers ?Overall transfer level: Independent ?Equipment used: None ?  ?  ?  ?  ?  ?  ?  ?  ?  ? ?Ambulation/Gait ?Ambulation/Gait assistance: Independent ?Gait Distance (Feet): 200 Feet ?Assistive device: None ?Gait Pattern/deviations: WFL(Within Functional Limits) ?  ?Gait velocity interpretation: >2.62 ft/sec, indicative of community ambulatory ?  ?  ? ?Stairs ?Stairs: Yes ?Stairs assistance:  Independent ?Stair Management: One rail Left, Alternating pattern, Forwards ?Number of Stairs: 6 ?  ? ?Wheelchair Mobility ?  ? ?Modified Rankin (Stroke Patients Only) ?  ? ?  ? ?Balance Overall balance assessment: Mild deficits observed, not formally tested ?  ?  ?  ?  ?  ?  ?  ?  ?  ?  ?  ?  ?  ?  ?  ?  ?  ?  ?   ? ? ? ?Pertinent Vitals/Pain Pain Assessment ?Pain Assessment: No/denies pain  ? ? ?Home Living Family/patient expects to be discharged to:: Private residence ?Living Arrangements: Spouse/significant other ?Available Help at Discharge: Family ?Type of Home: House ?Home Access: Stairs to enter ?Entrance Stairs-Rails: Can reach both ?Entrance Stairs-Number of Steps: 6 ?  ?Home Layout: One level ?Home Equipment: Shower seat ?   ?  ?Prior Function Prior Level of Function : Independent/Modified Independent;Driving ?  ?  ?  ?  ?  ?  ?  ?  ?  ? ? ?Hand Dominance  ?   ? ?  ?Extremity/Trunk Assessment  ? Upper Extremity Assessment ?Upper Extremity Assessment: Defer to OT evaluation ?  ? ?Lower Extremity Assessment ?Lower Extremity Assessment: Overall WFL for tasks assessed (Patient states R LE fatigue) ?  ? ?Cervical / Trunk Assessment ?Cervical / Trunk Assessment: Normal  ?Communication  ? Communication: No difficulties  ?Cognition Arousal/Alertness: Awake/alert ?Behavior During Therapy: [facility] for tasks assessed/performed ?Overall Cognitive Status: Within Functional Limits for tasks assessed ?  ?  ?  ?  ?  ?  ?  ?  ?  ?  ?  ?  ?  ?  ?  ?  ?  ?  ?  ? ?  ?  General Comments   ? ?  ?Exercises    ? ?Assessment/Plan  ?  ?PT Assessment Patient does not need any further PT services  ?PT Problem List   ? ?   ?  ?PT Treatment Interventions     ? ?PT Goals (Current goals can be found in the Care Plan section)  ?Acute Rehab PT Goals ?Patient Stated Goal: to go home ?PT Goal Formulation: All assessment and education complete, DC therapy ? ?  ?Frequency   ?  ? ? ?Co-evaluation   ?  ?  ?  ?  ? ? ?  ?AM-PAC PT "6 Clicks" Mobility   ?Outcome Measure Help needed turning from your back to your side while in a flat bed without using bedrails?: None ?Help needed moving from lying on your back to sitting on the side of a flat bed without using bedrails?: None ?Help needed moving to and from a bed to a chair (including a wheelchair)?: None ?Help needed standing up from a chair using your arms (e.g., wheelchair or bedside chair)?: None ?Help needed to walk in hospital room?: None ?Help needed climbing 3-5 steps with a railing? : None ?6 Click Score: 24 ? ?  ?End of Session   ?Activity Tolerance: Patient tolerated treatment well ?Patient left: in bed;with call bell/phone within reach ?Nurse Communication: Mobility status ?PT Visit Diagnosis: Muscle weakness (generalized) (M62.81) ?  ? ?Time: 1740-8144 ?PT Time Calculation (min) (ACUTE ONLY): 11 min ? ? ?Charges:   PT Evaluation ?$PT Eval Low Complexity: 1 Low ?  ?  ?   ? ? ?Mirabelle Cyphers A. Gilford Rile, PT, DPT ?Acute Rehabilitation Services ?Pager [phone] ?Office [phone] ? ? ?Donald Pacheco ?01/23/2022, 4:26 PM ? ?

## 2022-01-23 NOTE — TOC Initial Note (Signed)
Transition of Care (TOC) - Initial/Assessment Note  ? ? ?Patient Details  ?Name: Donald Pacheco ?MRN: [id number] ?Date of Birth: 25-May-1951 ? ?Transition of Care (TOC) CM/SW Contact:    ?Tom-Johnson, Renea Ee, RN ?Phone Number: ?01/23/2022, 4:24 PM ? ?Clinical Narrative:                 ? ?CM spoke with patient and wife, Donald Pacheco at bedside about needs for post hospital transition. Admitted for Back pain. Has three children and are supportive with care. Independent with care and drive self prior to admission. Has a cane, walker, shower seat and grab bars at home.  ?Does not have a PCP. Donald Pacheco states she has been trying to get patient in at [facility] but unsuccessful as they are not accepting new patients at this time. List of clinics given to patient and Donald Pacheco and both chose [facility]. CM called [facility] and scheduled hospital f/u and information is on AVS. Family to transport at discharge. ?Awaiting PT/OT eval for disposition. CM will continue to follow with needs.  ?  ?Barriers to Discharge: Continued Medical Work up ? ? ?Patient Goals and CMS Choice ?Patient states their goals for this hospitalization and ongoing recovery are:: To return home ?CMS Medicare.gov Compare Post Acute Care list provided to:: Patient ?  ? ?Expected Discharge Plan and Services ?  ?  ?Discharge Planning Services: CM Consult ?  ?Living arrangements for the past 2 months: [place] ?                ?  ?  ?  ?  ?  ?  ?  ?  ?  ?  ? ?Prior Living Arrangements/Services ?Living arrangements for the past 2 months: [place] ?Lives with:: Spouse ?Patient language and need for interpreter reviewed:: Yes ?Do you feel safe going back to the place where you live?: Yes      ?Need for Family Participation in Patient Care: Yes (Comment) ?Care giver support system in place?: Yes (comment) ?Current home services: DME (Cane, walker, shower seat, grab bars) ?Criminal Activity/Legal Involvement Pertinent to Current  Situation/Hospitalization: No - Comment as needed ? ?Activities of Daily Living ?Home Assistive Devices/Equipment: Eyeglasses ?ADL Screening (condition at time of admission) ?Patient's cognitive ability adequate to safely complete daily activities?: Yes ?Is the patient deaf or have difficulty hearing?: No ?Does the patient have difficulty seeing, even when wearing glasses/contacts?: No ?Does the patient have difficulty concentrating, remembering, or making decisions?: No ?Patient able to express need for assistance with ADLs?: No ?Does the patient have difficulty dressing or bathing?: No ?Independently performs ADLs?: Yes (appropriate for developmental age) ?Does the patient have difficulty walking or climbing stairs?: No ?Weakness of Legs: Both ?Weakness of Arms/Hands: None ? ?Permission Sought/Granted ?Permission sought to share information with : Case Manager, Family Supports ?Permission granted to share information with : Yes, Verbal Permission Granted ?   ?   ?   ?   ? ?Emotional Assessment ?Appearance:: Appears stated age ?Attitude/Demeanor/Rapport: Engaged, Gracious ?  ?Orientation: : Oriented to Self, Oriented to Place, Oriented to  Time, Oriented to Situation ?Alcohol / Substance Use: Not Applicable ?Psych Involvement: No (comment) ? ?Admission diagnosis:  Back pain [M54.9] ?Metastatic malignant neoplasm, unspecified site [facility]) [C79.9] ?Patient Active Problem List  ? Diagnosis Date Noted  ? Back pain 01/22/2022  ? Renal dysfunction 01/22/2022  ? Acute hyperglycemia 01/22/2022  ? DNR (do not resuscitate) 01/22/2022  ? Metastatic malignant neoplasm ([place])   ?  HYPERTENSION, BENIGN ESSENTIAL 12/11/2009  ? ABDOMINAL BRUIT 12/11/2009  ? HYPERCHOLESTEROLEMIA 12/24/2006  ? TOBACCO DEPENDENCE 12/24/2006  ? ?PCP:  Pcp, No ?Pharmacy:   ?[place] [place] ?[address] [place] ?[place] [address] ?Phone: [phone] Fax: [phone] ? ? ? ? ?Social  Determinants of Health (SDOH) Interventions ?  ? ?Readmission Risk Interventions ?   ? View : No data to display.  ?  ?  ?  ? ? ? ?

## 2022-01-23 NOTE — Anesthesia Preprocedure Evaluation (Signed)
Anesthesia Evaluation  ?Patient identified by MRN, date of birth, ID band ?Patient awake ? ? ? ?Reviewed: ?Allergy & Precautions, NPO status , Patient's Chart, lab work & pertinent test results ? ?History of Anesthesia Complications ?Negative for: history of anesthetic complications ? ?Airway ?Mallampati: III ? ?TM Distance: >3 FB ?Neck ROM: Full ? ? ? Dental ? ?(+) Loose, Poor Dentition, Chipped,  ?  ?Pulmonary ?neg shortness of breath, neg COPD, neg recent URI, Current Smoker and Patient abstained from smoking.,  ?  ?breath sounds clear to auscultation ? ? ? ? ? ? Cardiovascular ?hypertension, (-) angina(-) Past MI and (-) CHF  ?Rhythm:Regular  ? ?  ?Neuro/Psych ?negative neurological ROS ? negative psych ROS  ? GI/Hepatic ?negative GI ROS, Neg liver ROS,   ?Endo/Other  ?negative endocrine ROSLab Results ?     Component                Value               Date                 ?     HGBA1C                   5.0                 01/22/2022           ? ? Renal/GU ?ARFRenal diseaseLab Results ?     Component                Value               Date                 ?     CREATININE               2.63 (H)            01/23/2022           ?  ? ?  ?Musculoskeletal ?negative musculoskeletal ROS ?(+)  ? Abdominal ?  ?Peds ? Hematology ? ?(+) Blood dyscrasia, anemia , Lab Results ?     Component                Value               Date                 ?     WBC                      6.5                 01/23/2022           ?     HGB                      11.5 (L)            01/23/2022           ?     HCT                      30.7 (L)            01/23/2022           ?     MCV  91.9                01/23/2022           ?     PLT                      275                 01/23/2022           ?   ?Anesthesia Other Findings ?Back pain and lung mass ? Reproductive/Obstetrics ? ?  ? ? ? ? ? ? ? ? ? ? ? ? ? ?  ?  ? ? ? ? ? ? ? ? ?Anesthesia Physical ?Anesthesia Plan ? ?ASA:  3 ? ?Anesthesia Plan: General  ? ?Post-op Pain Management: Minimal or no pain anticipated  ? ?Induction: Intravenous ? ?PONV Risk Score and Plan: 1 and Propofol infusion, TIVA and Ondansetron ? ?Airway Management Planned: Oral ETT ? ?Additional Equipment: None ? ?Intra-op Plan:  ? ?Post-operative Plan: Extubation in OR ? ?Informed Consent: I have reviewed the patients History and Physical, chart, labs and discussed the procedure including the risks, benefits and alternatives for the proposed anesthesia with the patient or authorized representative who has indicated his/her understanding and acceptance.  ? ? ? ?Dental advisory given ? ?Plan Discussed with: CRNA and Anesthesiologist ? ?Anesthesia Plan Comments:   ? ? ? ? ? ? ?Anesthesia Quick Evaluation ? ?

## 2022-01-23 NOTE — Progress Notes (Signed)
Initial Nutrition Assessment ? ?DOCUMENTATION CODES:  ?Not applicable ? ?INTERVENTION:  ?-Ensure Enlive po TID, each supplement provides 350 kcal and 20 grams of protein. ?-MVI with minerals daily ? ?NUTRITION DIAGNOSIS:  ?Unintentional weight loss related to cancer and cancer related treatments as evidenced by per patient/family report. ? ?GOAL:  ?Patient will meet greater than or equal to 90% of their needs ? ?MONITOR:  ?PO intake, Supplement acceptance, Weight trends, Labs, I & O's ? ?REASON FOR ASSESSMENT:  ?Consult ?Other (Comment) ("nutritional goals") ? ?ASSESSMENT:  ?Pt with PMH significant for HTN, HLD, tobacco use presenting with back pain, 25 lb weight loss over the last several weeks, and shortness of breath. ED work-up showed concern for metastatic lung cancer -- pulmonology consulted. Admitted dx is back pain 2/2 metastatic malignant neoplasm, likely lung primary ? ?Pt out of room at time of RD visit for Flexible bronchoscopy and EBUS Bronchoscopy ? ?Previous weight history in chart from 2014 and unable to use for comparison. Will attempt to obtain diet/weight history and NFPE upon follow-up.  ? ?PO Intake: none documented as pt has been NPO  ? ?UOP: none documented x24 hours ?I/O: +1383m since admit ? ?Medications: ?Scheduled Meds: ? docusate sodium  100 mg Oral BID  ? feeding supplement  237 mL Oral TID BM  ? midazolam      ? multivitamin with minerals  1 tablet Oral Daily  ? nicotine  14 mg Transdermal Daily  ?Continuous Infusions: ? lactated ringers 75 mL/hr at 01/23/22 1021  ? ?Labs: ?Recent Labs  ?Lab 01/22/22 ?1111 01/23/22 ?0552  ?NA 139 141  ?K 4.8 4.2  ?CL 106 108  ?CO2 22 25  ?BUN 37* 30*  ?CREATININE 3.22* 2.63*  ?CALCIUM 9.0 8.6*  ?GLUCOSE 117* 89  ? ?NUTRITION - FOCUSED PHYSICAL EXAM: ?Unable to complete at this time as pt is out of room. Will attempt at follow-up.  ? ?Diet Order:   ?Diet Order   ? ?       ?  Diet regular Room service appropriate? Yes; Fluid consistency: Thin  Diet  effective now       ?  ? ?  ?  ? ?  ? ?EDUCATION NEEDS:  ?No education needs have been identified at this time ? ?Skin:  Skin Assessment: Reviewed RN Assessment ? ?Last BM:  3/29 ? ?Height:  ?Ht Readings from Last 1 Encounters:  ?01/23/22 _0  (1.727 m)  ? ?Weight:  ?Wt Readings from Last 1 Encounters:  ?01/23/22 59.3 kg  ? ?BMI:  Body mass index is 19.88 kg/m?. ? ?Estimated Nutritional Needs:  ?Kcal:  1800-2000 ?Protein:  90-100 grams ?Fluid:  >1.8L ? ?Donald Stanley, MS, RD, LDN (she/her/hers) ?RD pager number and weekend/on-call pager number located in [facility] ? ?

## 2022-01-23 NOTE — Telephone Encounter (Signed)
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 ?Help with referrals to MedOnc/RadOnc and for PET/CT once path back. ?Add to [place] please and thank you. ?

## 2022-01-23 NOTE — Op Note (Addendum)
Flexible and EBUS Bronchoscopy Procedure Note ? ?Donald Pacheco  ?[id number]  ?April 24, 1951 ? ?Date:01/23/22  ?Time:10:59 AM  ? ?Provider Performing:Amado Andal C Tamala Julian  ? ?Procedure: Flexible bronchoscopy and EBUS Bronchoscopy ? ?Indication(s) ?Lung mass, adenopathy ? ?Consent ?Risks of the procedure as well as the alternatives and risks of each were explained to the patient and/or caregiver.  Consent for the procedure was obtained. ? ?Anesthesia ?General Anesthesia ? ? ?Time Out ?Verified patient identification, verified procedure, site/side was marked, verified correct patient position, special equipment/implants available, medications/allergies/relevant history reviewed, required imaging and test results available. ? ? ?Sterile Technique ?Usual hand hygiene, masks, gowns, and gloves were used ? ? ?Procedure Description ?EBUS advanced into airway and station 4R biopsied x 1 for slide, adequate specimen on microscope then sampled an additional 5 times to build cell block.   ? ?EBUS scope removed.  Flex scope advanced and airways examined down to subsegmental level.  Suctioned small amount of blood from sampling site then withdrew scope. ? ?Findings:  ?- enlarged 4R ?- LLL extrinsic compression with some irregularity on bronchial mucosa indicating possible endobronchial spread ? ?Complications/Tolerance ?None; patient tolerated the procedure well. ?Chest X-ray is not needed post procedure. ? ? ?EBL ?Minimal ? ? ?Specimen(s) ?Station 4R ? ?

## 2022-01-23 NOTE — Progress Notes (Signed)
?  Echocardiogram ?2D Echocardiogram has been performed. ? ?Donald Pacheco ?01/23/2022, 4:54 PM ?

## 2022-01-23 NOTE — Progress Notes (Signed)
Patient received from ED  , A&Ox4  , VSS  , Patient denies SOB and CP, patient educated regarding personal belongings policy, oriented to the room and floor Routine, will continue to monitor.  ?

## 2022-01-23 NOTE — Progress Notes (Signed)
PT Cancellation Note ? ?Patient Details ?Name: Donald Pacheco ?MRN: [id number] ?DOB: [birth year] ? ? ?Cancelled Treatment:    Reason Eval/Treat Not Completed: Patient at procedure or test/unavailable.  In endoscopy and will retry as time and pt allow. ? ? ?Ramond Dial ?01/23/2022, 10:30 AM ? ?Mee Hives, PT PhD ?Acute Rehab Dept. Number: [facility] [phone] and [place] [phone] ? ?

## 2022-01-23 NOTE — Anesthesia Postprocedure Evaluation (Signed)
Anesthesia Post Note ? ?Patient: Donald Pacheco ? ?Procedure(s) Performed: VIDEO BRONCHOSCOPY WITH ENDOBRONCHIAL ULTRASOUND (Right) ?FINE NEEDLE ASPIRATION (FNA) EBUS ? ?  ? ?Patient location during evaluation: PACU ?Anesthesia Type: General ?Level of consciousness: awake and alert ?Pain management: pain level controlled ?Vital Signs Assessment: post-procedure vital signs reviewed and stable ?Respiratory status: spontaneous breathing, nonlabored ventilation, respiratory function stable and patient connected to nasal cannula oxygen ?Cardiovascular status: blood pressure returned to baseline and stable ?Postop Assessment: no apparent nausea or vomiting ?Anesthetic complications: no ? ? ?No notable events documented. ? ?Last Vitals:  ?Vitals:  ? 01/23/22 1210 01/23/22 1230  ?BP: (!) 103/50 110/83  ?Pulse: 60 71  ?Resp: 13 19  ?Temp: 36.7 ?C 36.8 ?C  ?SpO2: 95% (!) 89%  ?  ?Last Pain:  ?Vitals:  ? 01/23/22 1230  ?TempSrc: Oral  ?PainSc:   ? ? ?  ?  ?  ?  ?  ?  ? ?Donald Pacheco ? ? ? ? ?

## 2022-01-23 NOTE — Progress Notes (Addendum)
?PROGRESS NOTE ? ?Donald Pacheco NTI:[id number] DOB: [birth year] DOA: 01/22/2022 ?PCP: Pcp, No ? ? LOS: 1 day  ? ?Brief Narrative / Interim history: ?71 year old male with history of HTN, HLD, tobacco use, who does not have a PCP and does not seek regular care, last time saw a doctor in 2012, who comes into the hospital with progressive back pain for the past couple of weeks, shortness of breath, cough, as well as a 25 pound weight loss over the last several weeks.  Primary work-up in the ED showed concern for metastatic lung cancer, pulmonary was consulted and he was admitted to the hospital ? ?Subjective / 24h Interval events: ?Doing well this morning, denies any chest pain, denies any shortness of breath at rest.  Has not been up ? ?Assesement and Plan: ?Principal Problem: ?  Back pain ?Active Problems: ?  Metastatic malignant neoplasm ([place]) ?  Renal dysfunction ?  HYPERCHOLESTEROLEMIA ?  TOBACCO DEPENDENCE ?  HYPERTENSION, BENIGN ESSENTIAL ?  Acute hyperglycemia ?  DNR (do not resuscitate) ? ? ?Assessment and Plan: ?Principal problem ?Back pain due to metastatic malignant neoplasm, likely lung primary -CT scan on admission with a 6.6 cm nodule infiltrate in the left lower lobe, as well as hilar and mediastinal lymphadenopathy causing extrinsic compression of the left lower lobe bronchus, also with several areas of concern for bone metastasis.  Pulmonary consulted, he will undergo EBUS today with biopsy.  Pulmonary will arrange outpatient oncology follow-up likely next week once biopsies will be available.  An MRI of the brain was negative for intracranial metastasis ? ?Active problems ?Acute kidney injury, strong suspicion for chronic kidney disease, unknown stage-patient's most recent creatinine was 2011, 1.1.  On admission his creatinine was 3.2, received fluids and decreasing to 2.6 today, suggesting a component of dehydration/prerenal/acute kidney injury.  Renal ultrasound with medical renal disease,  indicating chronic kidney disease but unclear stage.  Continue fluids for today as he is likely dehydrated given poor p.o. intake and weight loss ? ?Diffuse atherosclerosis-CT scan showed possible calcification in the renal artery branches, thoracic aorta calcifications as well as coronary artery calcifications. ? ?Dyspnea on exertion -progressive, worse over the last 2 weeks.  May be related to his cancer but given coronary calcifications will obtain a 2D echo. ? ?Acute hyperglycemia -A1c 5.0, he is not a diabetic ? ?Essential hypertension-Previously took Norvasc and HCTZ, not on meds in over a decade.  Based on blood pressure trends may need to resume ? ?Tobacco use-recommend that he quit, placed on nicotine patch ? ?Hyperlipidemia-will need statin on discharge ? ?Suppressed TSH-at 0.26, obtain free T3 and free T4 ? ? ?Scheduled Meds: ? [MAR Hold] docusate sodium  100 mg Oral BID  ? [MAR Hold] nicotine  14 mg Transdermal Daily  ? ?Continuous Infusions: ? lactated ringers 75 mL/hr at 01/23/22 8676  ? ?PRN Meds:.[MAR Hold] acetaminophen **OR** [MAR Hold] acetaminophen, [MAR Hold] albuterol, [MAR Hold] bisacodyl, [MAR Hold] hydrALAZINE, [MAR Hold]  morphine injection, [MAR Hold] ondansetron **OR** [MAR Hold] ondansetron (ZOFRAN) IV, [MAR Hold] oxyCODONE, [MAR Hold] polyethylene glycol ? ?Diet Orders (From admission, onward)  ? ?  Start     Ordered  ? 01/23/22 0306  Diet NPO time specified  Diet effective now       ? 01/23/22 1950  ? ?  ?  ? ?  ? ? ?DVT prophylaxis: SCDs Start: 01/22/22 1501 ? ? ?Lab Results  ?Component Value Date  ? PLT 275 01/23/2022  ? ? ?  Code Status: DNR ? ?Family Communication: son at bedside  ? ?Status is: Inpatient ?Remains inpatient appropriate because: AKI, fluids, echo ? ? ?Level of care: Med-Surg ? ?Consultants:  ?Pulmonary  ? ?Procedures:  ?2D echo: pending ? ?Microbiology  ?none ? ?Antimicrobials: ?none  ? ? ?Objective: ?Vitals:  ? 01/23/22 0114 01/23/22 0153 01/23/22 0518 01/23/22 8657   ?BP: 140/80 (!) 140/91 133/71 (!) 144/77  ?Pulse: 68 69 64 70  ?Resp: 18 17 16 11   ?Temp: 97.8 ?F (36.6 ?C) 98.3 ?F (36.8 ?C) 97.9 ?F (36.6 ?C) 98.6 ?F (37 ?C)  ?TempSrc: Oral Oral  Oral  ?SpO2: 95% 99% 96% 94%  ?Weight:  59.3 kg  59.3 kg  ?Height:  5\' 8"  (1.727 m)  5\' 8"  (1.727 m)  ? ? ?Intake/Output Summary (Last 24 hours) at 01/23/2022 1004 ?Last data filed at 01/23/2022 [phone] ?Gross per 24 hour  ?Intake 968.23 ml  ?Output 0 ml  ?Net 968.23 ml  ? ?Wt Readings from Last 3 Encounters:  ?01/23/22 59.3 kg  ?06/10/13 69.4 kg  ?03/14/13 67.6 kg  ? ? ?Examination: ? ?Constitutional: NAD ?Eyes: no scleral icterus ?ENMT: Mucous membranes are moist.  ?Neck: normal, supple ?Respiratory: clear to auscultation bilaterally, no wheezing, no crackles.  ?Cardiovascular: Regular rate and rhythm, no murmurs / rubs / gallops. No LE edema. ?Abdomen: non distended, no tenderness. Bowel sounds positive.  ?Musculoskeletal: no clubbing / cyanosis.  ?Skin: no rashes ?Neurologic: non focal  ? ?Data Reviewed: I have independently reviewed following labs and imaging studies  ? ?CBC ?Recent Labs  ?Lab 01/22/22 ?1111 01/23/22 ?0552  ?WBC 8.5 6.5  ?HGB 13.3 11.5*  ?HCT 35.7* 30.7*  ?PLT 285 275  ?MCV 93.5 91.9  ?MCH 34.8* 34.4*  ?MCHC 37.3* 37.5*  ?RDW 13.8 13.8  ?LYMPHSABS 0.5*  --   ?MONOABS 0.9  --   ?EOSABS 0.0  --   ?BASOSABS 0.0  --   ? ? ?Recent Labs  ?Lab 01/22/22 ?1111 01/22/22 ?1112 01/22/22 ?1635 01/23/22 ?0552  ?NA 139  --   --  141  ?K 4.8  --   --  4.2  ?CL 106  --   --  108  ?CO2 22  --   --  25  ?GLUCOSE 117*  --   --  89  ?BUN 37*  --   --  30*  ?CREATININE 3.22*  --   --  2.63*  ?CALCIUM 9.0  --   --  8.6*  ?AST 16  --   --   --   ?ALT 11  --   --   --   ?ALKPHOS 281*  --   --   --   ?BILITOT 0.4  --   --   --   ?ALBUMIN 3.7  --   --   --   ?TSH  --  0.297* 0.263*  --   ?HGBA1C  --   --  5.0  --   ? ? ?------------------------------------------------------------------------------------------------------------------ ?No results  for input(s): CHOL, HDL, LDLCALC, TRIG, CHOLHDL, LDLDIRECT in the last 72 hours. ? ?Lab Results  ?Component Value Date  ? HGBA1C 5.0 01/22/2022  ? ?------------------------------------------------------------------------------------------------------------------ ?Recent Labs  ?  01/22/22 ?1635  ?TSH 0.263*  ? ? ?Cardiac Enzymes ?No results for input(s): CKMB, TROPONINI, MYOGLOBIN in the last 168 hours. ? ?Invalid input(s): CK ?------------------------------------------------------------------------------------------------------------------ ?No results found for: BNP ? ?CBG: ?No results for input(s): GLUCAP in the last 168 hours. ? ?No results found for this or any previous visit (  from the past 240 hour(s)).  ? ?Radiology Studies: ?DG Chest 2 View ? ?Result Date: 01/22/2022 ?CLINICAL DATA:  Shortness of breath.  Coughing. EXAM: CHEST - 2 VIEW COMPARISON:  None. FINDINGS: The cardio pericardial silhouette is enlarged. Patchy airspace disease noted at the right base with retrocardiac left base collapse/consolidation. No substantial pleural effusion. The visualized bony structures of the thorax are unremarkable. IMPRESSION: 1. Dense retrocardiac consolidative opacity, likely pneumonia. As neoplasm could have a similar appearance, close follow-up recommended and CT chest may be warranted to further evaluate depending on clinical picture. 2. Patchy subtle airspace disease at the right base. Electronically Signed   By: Misty Stanley M.D.   On: 01/22/2022 10:52  ? ?CT Chest Wo Contrast ? ?Result Date: 01/22/2022 ?CLINICAL DATA:  Pneumonia EXAM: CT CHEST WITHOUT CONTRAST TECHNIQUE: Multidetector CT imaging of the chest was performed following the standard protocol without IV contrast. RADIATION DOSE REDUCTION: This exam was performed according to the departmental dose-optimization program which includes automated exposure control, adjustment of the mA and/or kV according to patient size and/or use of iterative reconstruction  technique. COMPARISON:  Chest radiographs done earlier today FINDINGS: Cardiovascular: There are scattered calcifications in the thoracic aorta. Coronary artery calcifications are seen. Heart is enlarged in s

## 2022-01-23 NOTE — Progress Notes (Signed)
SLP Cancellation Note ? ?Patient Details ?Name: Donald Pacheco ?MRN: [id number] ?DOB: [birth year] ? ? ?Cancelled treatment:       Reason Eval/Treat Not Completed: SLP screened, no needs identified, will sign off;Marland Kitchen Per chart review and speaking with patient's RN and PT, no evidence of cognitive decline. Please reorder SLP if any concerns of dysphagia or cognitive-linguistic impairment. Thank you for this referral! ? ? ?Sonia Baller, MA, CCC-SLP ?Speech Therapy ? ?

## 2022-01-23 NOTE — Plan of Care (Signed)

## 2022-01-23 NOTE — Transfer of Care (Signed)
Immediate Anesthesia Transfer of Care Note ? ?Patient: Donald Pacheco ? ?Procedure(s) Performed: VIDEO BRONCHOSCOPY WITH ENDOBRONCHIAL ULTRASOUND (Right) ?FINE NEEDLE ASPIRATION (FNA) EBUS ? ?Patient Location: PACU ? ?Anesthesia Type:MAC ? ?Level of Consciousness: drowsy, patient cooperative and responds to stimulation ? ?Airway & Oxygen Therapy: Patient Spontanous Breathing ? ?Post-op Assessment: Report given to RN and Post -op Vital signs reviewed and stable ? ?Post vital signs: Reviewed and stable ? ?Last Vitals:  ?Vitals Value Taken Time  ?BP 116/74 01/23/22 1109  ?Temp    ?Pulse 71 01/23/22 1113  ?Resp 18 01/23/22 1113  ?SpO2 98 % 01/23/22 1113  ?Vitals shown include unvalidated device data. ? ?Last Pain:  ?Vitals:  ? 01/23/22 0929  ?TempSrc: Oral  ?PainSc: 0-No pain  ?   ? ?  ? ?Complications: No notable events documented. ?

## 2022-01-23 NOTE — Progress Notes (Signed)
OT Cancellation Note ? ?Patient Details ?Name: Donald Pacheco ?MRN: [id number] ?DOB: 1951/08/27 ? ? ?Cancelled Treatment:    Reason Eval/Treat Not Completed: Patient at procedure or test/ unavailable Off unit in endoscopy.  ? ?Layla Maw ?01/23/2022, 11:03 AM ?

## 2022-01-23 NOTE — Progress Notes (Addendum)
OT Cancellation Note ? ?Patient Details ?Name: Donald Pacheco ?MRN: [id number] ?DOB: 12-20-1950 ? ? ?Cancelled Treatment:    Reason Eval/Treat Not Completed: Other (comment) MD at bedside with pt/family. Will follow-up as pt available. ? ?Addendum: Re-attempted approximately 1 hour later and pt currently sleeping. ? ?Layla Maw ?01/23/2022, 7:21 AM ?

## 2022-01-23 NOTE — Progress Notes (Signed)
01/23/2022 ? ? ?I have seen and evaluated the patient for lung mass. ? ?S:  ?No events, back pain controlled on current regimen. ? ?Breathing normal, no cough/hemoptysis. ? ?O: ?Blood pressure 133/71, pulse 64, temperature 97.9 ?F (36.6 ?C), resp. rate 16, height 5\' 8"  (1.727 m), weight 59.3 kg, SpO2 96 %.  ?No distress ?Lungs clear ?Abdomen soft ?Heart sounds regular, ext warm ?Moves all 4 ext to command ?Aox3 ? ?MRI brain neg ?Has spinal mets ? ?A:  ?Likely stage IV lung cancer with mets to skeleton causing pain ?Progressive DOE- most likely related to cancer but has coronary calcifications, echo has been ordered by primary ?AKI vs. CKD- improved renal function with hydration ?Hx HTN, poor medical f/u ?Hx smoking ? ?P:  ?- Bronch/EBUS today ?- Will arrange OP MedOnc referrals, likely cannot get PET/CT approval until tissue diagnosis achieved ?- Pain control and AKI workup per primary ? ?Erskine Emery MD ?[facility] ?Prefer epic messenger for cross cover needs ?If after hours, please call E-link ? ?

## 2022-01-23 NOTE — Evaluation (Signed)
Occupational Therapy Evaluation ?Patient Details ?Name: Donald Pacheco ?MRN: [id number] ?DOB: 1951/07/13 ?Today's Date: 01/23/2022 ? ? ?History of Present Illness 71 y/o male presented to ED on 01/22/22 for back pain x 2 weeks. MRI showed suspected bone metastasis in C3 vertebral body. CXR concerned for pneumonia. Imaging also indicating metastatic disease. PMH: hx of metastatic lung cancer, HTN, tobacco dependence  ? ?Clinical Impression ?  ?Pt admitted for concerns listed above. PTA pt reported that he was independent with all ADL's and IADL's, using no AD. At this time, pt appears back at his baseline with no overt weakness or balance issues. He is independent with all BADL's and functional mobility and has no further OT needs. Acute OT will sign off.   ?   ? ?Recommendations for follow up therapy are one component of a multi-disciplinary discharge planning process, led by the attending physician.  Recommendations may be updated based on patient status, additional functional criteria and insurance authorization.  ? ?Follow Up Recommendations ? No OT follow up  ?  ?Assistance Recommended at Discharge None  ?Patient can return home with the following   ? ?  ?Functional Status Assessment ? Patient has had a recent decline in their functional status and demonstrates the ability to make significant improvements in function in a reasonable and predictable amount of time.  ?Equipment Recommendations ? None recommended by OT  ?  ?Recommendations for Other Services   ? ? ?  ?Precautions / Restrictions Precautions ?Precautions: None ?Restrictions ?Weight Bearing Restrictions: No  ? ?  ? ?Mobility Bed Mobility ?Overal bed mobility: Independent ?  ?  ?  ?  ?  ?  ?  ?  ? ?Transfers ?Overall transfer level: Independent ?Equipment used: None ?  ?  ?  ?  ?  ?  ?  ?  ?  ? ?  ?Balance Overall balance assessment: Mild deficits observed, not formally tested ?  ?  ?  ?  ?  ?  ?  ?  ?  ?  ?  ?  ?  ?  ?  ?  ?  ?  ?   ? ?ADL either  performed or assessed with clinical judgement  ? ?ADL Overall ADL's : At baseline;Modified independent ?  ?  ?  ?  ?  ?  ?  ?  ?  ?  ?  ?  ?  ?  ?  ?  ?  ?  ?  ?General ADL Comments: Pt able to complete all BADL's and functional mobility with no AD  ? ? ? ?Vision Baseline Vision/History: 1 Wears glasses ?Ability to See in Adequate Light: 0 Adequate ?Patient Visual Report: No change from baseline ?Vision Assessment?: No apparent visual deficits  ?   ?Perception   ?  ?Praxis   ?  ? ?Pertinent Vitals/Pain Pain Assessment ?Pain Assessment: No/denies pain  ? ? ? ?Hand Dominance Right ?  ?Extremity/Trunk Assessment Upper Extremity Assessment ?Upper Extremity Assessment: Overall WFL for tasks assessed ?  ?Lower Extremity Assessment ?Lower Extremity Assessment: Defer to PT evaluation ?  ?Cervical / Trunk Assessment ?Cervical / Trunk Assessment: Normal ?  ?Communication Communication ?Communication: No difficulties ?  ?Cognition Arousal/Alertness: Awake/alert ?Behavior During Therapy: [facility] for tasks assessed/performed ?Overall Cognitive Status: Within Functional Limits for tasks assessed ?  ?  ?  ?  ?  ?  ?  ?  ?  ?  ?  ?  ?  ?  ?  ?  ?  ?  ?  ?  General Comments  VSS on RA ? ?  ?Exercises   ?  ?Shoulder Instructions    ? ? ?Home Living Family/patient expects to be discharged to:: Private residence ?Living Arrangements: Spouse/significant other ?Available Help at Discharge: Family ?Type of Home: House ?Home Access: Stairs to enter ?Entrance Stairs-Number of Steps: 6 ?Entrance Stairs-Rails: Can reach both ?Home Layout: One level ?  ?  ?Bathroom Shower/Tub: Walk-in shower ?  ?Bathroom Toilet: Standard ?  ?  ?Home Equipment: Shower seat ?  ?  ?  ? ?  ?Prior Functioning/Environment Prior Level of Function : Independent/Modified Independent;Driving ?  ?  ?  ?  ?  ?  ?  ?  ?  ? ?  ?  ?OT Problem List: Decreased strength;Decreased activity tolerance ?  ?   ?OT Treatment/Interventions:    ?  ?OT Goals(Current goals can be found in the  care plan section) Acute Rehab OT Goals ?Patient Stated Goal: To go home ?OT Goal Formulation: With patient ?Time For Goal Achievement: 02/06/22 ?Potential to Achieve Goals: Good  ?OT Frequency:   ?  ? ?Co-evaluation   ?  ?  ?  ?  ? ?  ?AM-PAC OT "6 Clicks" Daily Activity     ?Outcome Measure Help from another person eating meals?: None ?Help from another person taking care of personal grooming?: None ?Help from another person toileting, which includes using toliet, bedpan, or urinal?: None ?Help from another person bathing (including washing, rinsing, drying)?: None ?Help from another person to put on and taking off regular upper body clothing?: None ?Help from another person to put on and taking off regular lower body clothing?: None ?6 Click Score: 24 ?  ?End of Session Nurse Communication: Mobility status ? ?Activity Tolerance: Patient tolerated treatment well ?Patient left: in bed;with call bell/phone within reach ? ?OT Visit Diagnosis: Unsteadiness on feet (R26.81);Other abnormalities of gait and mobility (R26.89);Muscle weakness (generalized) (M62.81)  ?              ?Time: 2585-2778 ?OT Time Calculation (min): 12 min ?Charges:  OT General Charges ?$OT Visit: 1 Visit ?OT Evaluation ?$OT Eval Moderate Complexity: 1 Mod ? ?Donald Pacheco H., OTR/L ?Acute Rehabilitation ? ?Donald Pacheco ?01/23/2022, 5:50 PM ?

## 2022-01-24 ENCOUNTER — Telehealth: Payer: Self-pay | Admitting: Radiation Oncology

## 2022-01-24 ENCOUNTER — Encounter (HOSPITAL_COMMUNITY): Payer: Self-pay | Admitting: Internal Medicine

## 2022-01-24 ENCOUNTER — Encounter: Payer: Self-pay | Admitting: *Deleted

## 2022-01-24 DIAGNOSIS — N184 Chronic kidney disease, stage 4 (severe): Secondary | ICD-10-CM

## 2022-01-24 DIAGNOSIS — R739 Hyperglycemia, unspecified: Secondary | ICD-10-CM | POA: Diagnosis not present

## 2022-01-24 DIAGNOSIS — R918 Other nonspecific abnormal finding of lung field: Secondary | ICD-10-CM

## 2022-01-24 DIAGNOSIS — I5022 Chronic systolic (congestive) heart failure: Secondary | ICD-10-CM | POA: Diagnosis not present

## 2022-01-24 LAB — BASIC METABOLIC PANEL
Anion gap: 9 (ref 5–15)
BUN: 39 mg/dL — ABNORMAL HIGH (ref 8–23)
CO2: 26 mmol/L (ref 22–32)
Calcium: 8.8 mg/dL — ABNORMAL LOW (ref 8.9–10.3)
Chloride: 104 mmol/L (ref 98–111)
Creatinine, Ser: 2.85 mg/dL — ABNORMAL HIGH (ref 0.61–1.24)
GFR, Estimated: 23 mL/min — ABNORMAL LOW (ref >60)
Glucose, Bld: 104 mg/dL — ABNORMAL HIGH (ref 70–99)
Potassium: 5.1 mmol/L (ref 3.5–5.1)
Sodium: 139 mmol/L (ref 135–145)

## 2022-01-24 LAB — CBC
HCT: 32.5 % — ABNORMAL LOW (ref 39.0–52.0)
Hemoglobin: 11.8 g/dL — ABNORMAL LOW (ref 13.0–17.0)
MCH: 34 pg (ref 26.0–34.0)
MCHC: 36.3 g/dL — ABNORMAL HIGH (ref 30.0–36.0)
MCV: 93.7 fL (ref 80.0–100.0)
Platelets: 293 10*3/uL (ref 150–400)
RBC: 3.47 MIL/uL — ABNORMAL LOW (ref 4.22–5.81)
RDW: 13.7 % (ref 11.5–15.5)
WBC: 8.8 10*3/uL (ref 4.0–10.5)
nRBC: 0 % (ref 0.0–0.2)

## 2022-01-24 LAB — T4, FREE: Free T4: 0.81 ng/dL (ref 0.61–1.12)

## 2022-01-24 LAB — CYTOLOGY - NON PAP

## 2022-01-24 MED ORDER — CARVEDILOL 3.125 MG PO TABS: 3.1250 mg | ORAL_TABLET | Freq: Two times a day (BID) | ORAL | 1 refills | Status: DC

## 2022-01-24 MED ORDER — LIDOCAINE 5 % EX PTCH: 1.0000 | MEDICATED_PATCH | Freq: Two times a day (BID) | CUTANEOUS | 0 refills | Status: DC

## 2022-01-24 MED ORDER — OXYCODONE HCL 5 MG PO TABS: 5.0000 mg | ORAL_TABLET | Freq: Four times a day (QID) | ORAL | 0 refills | Status: DC | PRN

## 2022-01-24 MED ORDER — CARVEDILOL 3.125 MG PO TABS
3.1250 mg | ORAL_TABLET | Freq: Two times a day (BID) | ORAL | Status: DC
  Administered 2022-01-24: 3.125 mg via ORAL
  Filled 2022-01-24: qty 1

## 2022-01-24 MED ORDER — NICOTINE 14 MG/24HR TD PT24: 14.0000 mg | MEDICATED_PATCH | Freq: Every day | TRANSDERMAL | 0 refills | Status: AC

## 2022-01-24 MED ORDER — ATORVASTATIN CALCIUM 40 MG PO TABS: 40.0000 mg | ORAL_TABLET | Freq: Every day | ORAL | 1 refills | Status: DC

## 2022-01-24 MED ORDER — ISOSORB DINITRATE-HYDRALAZINE 20-37.5 MG PO TABS
1.0000 | ORAL_TABLET | Freq: Two times a day (BID) | ORAL | 1 refills | Status: DC
Start: 2022-01-24 — End: 2022-02-19

## 2022-01-24 MED ORDER — ISOSORB DINITRATE-HYDRALAZINE 20-37.5 MG PO TABS
1.0000 | ORAL_TABLET | Freq: Two times a day (BID) | ORAL | Status: DC
  Administered 2022-01-24: 1 via ORAL
  Filled 2022-01-24: qty 1

## 2022-01-24 NOTE — Care Management Important Message (Signed)
Important Message ? ?Patient Details  ?Name: Donald Pacheco ?MRN: [id number] ?Date of Birth: Feb 15, 1951 ? ? ?Medicare Important Message Given:  Yes ? ? ? ? ?Dalya Maselli ?01/24/2022, 12:17 PM ?

## 2022-01-24 NOTE — Telephone Encounter (Signed)
3/31 @ 1:21 pm Left voicemail with patient's son Hutch for patient to call office to be scheduled for consult.   ?

## 2022-01-24 NOTE — Discharge Summary (Signed)
? ?Physician Discharge Summary  ?DIDIER BRANDENBURG UVO:[id number] DOB: [birth year] DOA: 01/22/2022 ? ?PCP: Pcp, No ? ?Admit date: 01/22/2022 ?Discharge date: 01/24/2022 ? ?Admitted From: home ?Disposition:  home ? ?Recommendations for Outpatient Follow-up:  ?Follow up with PCP in 1-2 weeks ?Please obtain BMP/CBC in one week ?Please follow up on the following pending results: ? ?Home Health: PT ?Equipment/Devices: none ? ?Discharge Condition: stable ?CODE STATUS: DNR ?Diet recommendation: heart healthy ? ?HPI: Per admitting MD, ?Donald Pacheco is a 71 y.o. male with medical history significant of HTN, HLD, and tobacco dependence presenting with back pain x 2 weeks.  He reports that for a couple of weeks he was having back pain in his R flank.  He has also been having SOB for about the same period of time.  The pain is worse with coughing.  The cough is also new and is nonproductive.  The pain is also worse when he first gets up but eases up with movement.  SOB is worse with exertion.  +weight loss, at least 20 pounds - usual weight was maybe 165-170.  +night sweats, not new but worse in the last 2 weeks. He last saw a doctor in maybe 2011 or 2012.  He continues to smoke, 0.5 ppd for 30+ years.   ? ?Hospital Course / Discharge diagnoses: ?Principal Problem: ?  Back pain ?Active Problems: ?  Metastatic malignant neoplasm ([employer]) ?  Renal dysfunction ?  HYPERCHOLESTEROLEMIA ?  TOBACCO DEPENDENCE ?  HYPERTENSION, BENIGN ESSENTIAL ?  Acute hyperglycemia ?  DNR (do not resuscitate) ?  CKD (chronic kidney disease) stage 4, GFR 15-29 ml/min (HCC) ?  Chronic systolic CHF (congestive heart failure) ([place]) ? ? ?Assessment and Plan: ?Principal problem ?Back pain due to stage IV metastatic malignant neoplasm, likely lung primary -CT scan on admission with a 6.6 cm nodule infiltrate in the left lower lobe, as well as hilar and mediastinal lymphadenopathy causing extrinsic compression of the left lower lobe bronchus, also with several  areas of concern for bone metastasis.  Pulmonary consulted, he underwent EBUS on 3/30, with biopsy.  Pulmonary will arrange outpatient oncology follow-up likely next week once biopsies will be available.  An MRI of the brain was negative for intracranial metastasis.  He was also placed on oxycodone due to severe back pain ?  ?Active problems ?Acute kidney injury, suspicion for CKD 4-patient's most recent creatinine was 2011, 1.1.  On admission his creatinine was 3.2, received fluids and decreasing to 2.6, suggesting a component of dehydration/prerenal/acute kidney injury.  Creatinine has stabilized around 2.8 at the time of discharge.  Suspect this is his baseline.  [place] [facility] contacted, they will arrange outpatient follow-up. Renal ultrasound with medical renal disease, indicating chronic kidney disease  ?Diffuse atherosclerosis-CT scan showed possible calcification in the renal artery branches, thoracic aorta calcifications as well as coronary artery calcifications.  This will likely be followed up as an outpatient by vascular  ?Chronic systolic CHF, mild -2D echo was done shows slightly depressed EF at 40-45% with global hypokinesis.  He also has grade 1 diastolic dysfunction.  RV was normal.  No significant valvular pathology.  Case was discussed with Dr. Terrence Dupont, patient was placed on BiDil as well as Coreg per his recommendations, and will see him as an outpatient in 2 days on Monday.  He has no chest pain or anginal type symptoms ?Dyspnea on exertion -progressive, suspect multifactorial related to his cancer, chronic systolic CHF, deconditioning with weight loss ?Acute hyperglycemia -on admission,  resolved, A1c 5.0, he is not a diabetic ?Essential hypertension-placed on BiDil and Coreg as above.  Dose to be uptitrated as an outpatient as indicated  ?Tobacco use-recommend that he quit, he will be prescribed nicotine patch ?Hyperlipidemia-placed on statin on discharge  ?Suppressed TSH-at 0.26, free T4  within normal limits.  Recommend recheck TSH and free T3 and T4 in 3 to 4 weeks. ? ?Sepsis ruled out ? ? ?Discharge Instructions ? ? ?Allergies as of 01/24/2022   ?No Known Allergies ?  ? ?  ?Medication List  ?  ? ?STOP taking these medications   ? ?GOODY HEADACHE PO ?  ?Lidocaine 1.8 % Ptch ?Replaced by: lidocaine 5 % ?  ? ?  ? ?TAKE these medications   ? ?atorvastatin 40 MG tablet ?Commonly known as: Lipitor ?Take 1 tablet (40 mg total) by mouth daily. ?  ?carvedilol 3.125 MG tablet ?Commonly known as: COREG ?Take 1 tablet (3.125 mg total) by mouth 2 (two) times daily with a meal. ?  ?isosorbide-hydrALAZINE 20-37.5 MG tablet ?Commonly known as: BIDIL ?Take 1 tablet by mouth 2 (two) times daily. ?  ?lidocaine 5 % ?Commonly known as: Lidoderm ?Place 1 patch onto the skin every 12 (twelve) hours. Apply to most painful area.a Remove & Discard patch within 12 hours or as directed by MD ?Replaces: Lidocaine 1.8 % Ptch ?  ?nicotine 14 mg/24hr patch ?Commonly known as: NICODERM CQ - dosed in mg/24 hours ?Place 1 patch (14 mg total) onto the skin daily. ?Start taking on: January 25, 2022 ?  ?oxyCODONE 5 MG immediate release tablet ?Commonly known as: Oxy IR/ROXICODONE ?Take 1 tablet (5 mg total) by mouth every 6 (six) hours as needed for moderate pain or breakthrough pain. ?  ? ?  ? ? Follow-up Information   ? ? Charolette Forward, MD. Go to.   ?Specialty: Cardiology ?Why: On Monday, 01/27/2022 at 2:30 pm. Please arrive 30 minutes prior to your appointment. ?Contact information: ?[address] ?[address] ?[place] [address] ?[phone] ? ? ?  ?  ? ? [facility] [place]. Schedule an appointment as soon as possible for a visit in 1 week(s).   ?Contact information: ?[address] ?[place] [address] ?[phone] ? ? ?  ?  ? ?  ?  ? ?  ? ? ?Consultations: ?Pulmonary ? ?Procedures/Studies: ? ?DG Chest 2 View ? ?Result Date: 01/22/2022 ?CLINICAL DATA:  Shortness of breath.  Coughing. EXAM: CHEST - 2 VIEW COMPARISON:  None. FINDINGS:  The cardio pericardial silhouette is enlarged. Patchy airspace disease noted at the right base with retrocardiac left base collapse/consolidation. No substantial pleural effusion. The visualized bony structures of the thorax are unremarkable. IMPRESSION: 1. Dense retrocardiac consolidative opacity, likely pneumonia. As neoplasm could have a similar appearance, close follow-up recommended and CT chest may be warranted to further evaluate depending on clinical picture. 2. Patchy subtle airspace disease at the right base. Electronically Signed   By: Misty Stanley M.D.   On: 01/22/2022 10:52  ? ?CT Chest Wo Contrast ? ?Result Date: 01/22/2022 ?CLINICAL DATA:  Pneumonia EXAM: CT CHEST WITHOUT CONTRAST TECHNIQUE: Multidetector CT imaging of the chest was performed following the standard protocol without IV contrast. RADIATION DOSE REDUCTION: This exam was performed according to the departmental dose-optimization program which includes automated exposure control, adjustment of the mA and/or kV according to patient size and/or use of iterative reconstruction technique. COMPARISON:  Chest radiographs done earlier today FINDINGS: Cardiovascular: There are scattered calcifications in the thoracic aorta. Coronary  artery calcifications are seen. Heart is enlarged in size. Mediastinum/Nodes: There are enlarged lymph nodes in the mediastinum largest in the precarinal region measuring 2.1 cm in short axis. There is decreased inhomogeneous attenuation in the thyroid. Lungs/Pleura: There is extrinsic compression of left lower lobe bronchus, possibly due to enlarged lymph nodes in the left hilum. There is large nodular infiltrate measuring approximally 6.6 x 4.8 cm in the left lower lobe. There is linear density in the lingula with 12 mm nodularity in the lateral aspect. Centrilobular emphysema is seen. In the image 31 of series 4, there is 6 mm faint ground-glass nodular density in the left upper lobe. Small patchy infiltrate is seen  in the posterolateral aspect of right lower lobe. Increased interstitial markings are seen in the periphery of lower lung fields, more so in the right lower lung fields with subpleural honeycombing. In the

## 2022-01-24 NOTE — TOC Transition Note (Signed)
Transition of Care (TOC) - CM/SW Discharge Note ? ? ?Patient Details  ?Name: Donald Pacheco ?MRN: [id number] ?Date of Birth: [birth year] ? ?Transition of Care (TOC) CM/SW Contact:  ?Tom-Johnson, Renea Ee, RN ?Phone Number: ?01/24/2022, 1:50 PM ? ? ?Clinical Narrative:    ? ?Patient is scheduled for discharge today. No PT/OT f/u noted. Denies any other TOC needs. Hospital f/u on AVS. Family to transport at discharge. No further TOC needs noted.  ? ?Final next level of care: Home/Self Care ?Barriers to Discharge: Barriers Resolved ? ? ?Patient Goals and CMS Choice ?Patient states their goals for this hospitalization and ongoing recovery are:: To return home ?CMS Medicare.gov Compare Post Acute Care list provided to:: Patient ?Choice offered to / list presented to : NA ? ?Discharge Placement ?  ?           ?  ?Patient to be transferred to facility by: Son ?  ?  ? ?Discharge Plan and Services ?  ?Discharge Planning Services: CM Consult ?           ?DME Arranged: N/A ?DME Agency: NA ?  ?  ?  ?HH Arranged: NA ?[place] Agency: NA ?  ?  ?  ? ?Social Determinants of Health (SDOH) Interventions ?  ? ? ?Readmission Risk Interventions ?   ? View : No data to display.  ?  ?  ?  ? ? ? ? ? ?

## 2022-01-24 NOTE — Progress Notes (Signed)
Oncology Nurse Navigator Documentation ? ? ?  01/24/2022  ?  8:00 AM  ?Oncology Nurse Navigator Flowsheets  ?Abnormal Finding Date 01/22/2022  ?Confirmed Diagnosis Date 01/23/2022  ?Navigator Location [facility]  ?Referral Date to RadOnc/MedOnc 01/23/2022  ?Navigator Encounter Type Other:  ?Treatment Phase Abnormal Scans  ?Barriers/Navigation Needs Coordination of Care/I received referral from Dr. Tamala Julian.  I notified Dr. Julien Nordmann.  Referral to Rad Onc completed. Patient is currently in patient and intubated today.   ?Interventions Coordination of Care  ?Acuity Level 3-Moderate Needs (3-4 Barriers Identified)  ?Coordination of Care Other  ?Time Spent with Patient 30  ?  ?

## 2022-01-24 NOTE — Discharge Instructions (Signed)
Follow with oncology, follow-up will be set up for you ?Follow-up with cardiology as scheduled, Dr. Terrence Dupont, on Monday 4/3 at 2:30 PM ?[facility] [facility] will call you for a follow-up appointment, if they do not call by Tuesday please call them ? ?Please get a complete blood count and chemistry panel checked by your Primary MD at your next visit, and again as instructed by your Primary MD. Please get your medications reviewed and adjusted by your Primary MD. ? ?Please request your Primary MD to go over all Hospital Tests and Procedure/Radiological results at the follow up, please get all Hospital records sent to your Prim MD by signing hospital release before you go home. ? ?In some cases, there will be blood work, cultures and biopsy results pending at the time of your discharge. Please request that your primary care M.D. goes through all the records of your hospital data and follows up on these results. ? ?If you had Pneumonia of Lung problems at the Hospital: ?Please get a 2 view Chest X ray done in 6-8 weeks after hospital discharge or sooner if instructed by your Primary MD. ? ?If you have Congestive Heart Failure: ?Please call your Cardiologist or Primary MD anytime you have any of the following symptoms:  ?1) 3 pound weight gain in 24 hours or 5 pounds in 1 week  ?2) shortness of breath, with or without a dry hacking cough  ?3) swelling in the hands, feet or stomach  ?4) if you have to sleep on extra pillows at night in order to breathe ? ?Follow cardiac low salt diet and 1.5 lit/day fluid restriction. ? ?If you have diabetes ?Accuchecks 4 times/day, Once in AM empty stomach and then before each meal. ?Log in all results and show them to your primary doctor at your next visit. ?If any glucose reading is under 80 or above 300 call your primary MD immediately. ? ?If you have Seizure/Convulsions/Epilepsy: ?Please do not drive, operate heavy machinery, participate in activities at heights or participate in high  speed sports until you have seen by Primary MD or a Neurologist and advised to do so again. ?Per [facility] statutes, patients with seizures are not allowed to drive until they have been seizure-free for six months.  ?Use caution when using heavy equipment or power tools. Avoid working on ladders or at heights. Take showers instead of baths. Ensure the water temperature is not too high on the home water heater. Do not go swimming alone. Do not lock yourself in a room alone (i.e. bathroom). When caring for infants or small children, sit down when holding, feeding, or changing them to minimize risk of injury to the child in the event you have a seizure. Maintain good sleep hygiene. Avoid alcohol.  ? ?If you had Gastrointestinal Bleeding: ?Please ask your Primary MD to check a complete blood count within one week of discharge or at your next visit. Your endoscopic/colonoscopic biopsies that are pending at the time of discharge, will also need to followed by your Primary MD. ? ?Get Medicines reviewed and adjusted. ?Please take all your medications with you for your next visit with your Primary MD ? ?Please request your Primary MD to go over all hospital tests and procedure/radiological results at the follow up, please ask your Primary MD to get all Hospital records sent to his/her office. ? ?If you experience worsening of your admission symptoms, develop shortness of breath, life threatening emergency, suicidal or homicidal thoughts you must seek medical attention  immediately by calling 911 or calling your MD immediately  if symptoms less severe. ? ?You must read complete instructions/literature along with all the possible adverse reactions/side effects for all the Medicines you take and that have been prescribed to you. Take any new Medicines after you have completely understood and accpet all the possible adverse reactions/side effects.  ? ?Do not drive or operate heavy machinery when taking Pain medications.   ? ?Do not take more than prescribed Pain, Sleep and Anxiety Medications ? ?Special Instructions: If you have smoked or chewed Tobacco  in the last 2 yrs please stop smoking, stop any regular Alcohol  and or any Recreational drug use. ? ?Wear Seat belts while driving. ? ?Please note ?You were cared for by a hospitalist during your hospital stay. If you have any questions about your discharge medications or the care you received while you were in the hospital after you are discharged, you can call the unit and asked to speak with the hospitalist on call if the hospitalist that took care of you is not available. Once you are discharged, your primary care physician will handle any further medical issues. Please note that NO REFILLS for any discharge medications will be authorized once you are discharged, as it is imperative that you return to your primary care physician (or establish a relationship with a primary care physician if you do not have one) for your aftercare needs so that they can reassess your need for medications and monitor your lab values. ? ?You can reach the hospitalist office at phone [phone] or fax [phone] ?  ?If you do not have a primary care physician, you can call [phone] for a physician referral. ? ?Activity: As tolerated with Full fall precautions use walker/cane & assistance as needed ? ?  ?Diet: renal ? ?Disposition Home ?

## 2022-01-25 LAB — T3, FREE: T3, Free: 1.2 pg/mL — ABNORMAL LOW (ref 2.0–4.4)

## 2022-01-27 DIAGNOSIS — I5020 Unspecified systolic (congestive) heart failure: Secondary | ICD-10-CM | POA: Diagnosis not present

## 2022-01-27 DIAGNOSIS — N189 Chronic kidney disease, unspecified: Secondary | ICD-10-CM | POA: Diagnosis not present

## 2022-01-27 DIAGNOSIS — I10 Essential (primary) hypertension: Secondary | ICD-10-CM | POA: Diagnosis not present

## 2022-01-27 DIAGNOSIS — E785 Hyperlipidemia, unspecified: Secondary | ICD-10-CM | POA: Diagnosis not present

## 2022-01-27 NOTE — Progress Notes (Signed)
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 ?Biopsies ?CT Chest:  ?01/22/2022 ?Dr. Royston Cowper Rathinasamy  ? ?FINDINGS: ?Cardiovascular: There are scattered calcifications in the thoracic aorta. Coronary artery calcifications are seen. Heart is enlarged in size. ?  ?Mediastinum/Nodes: There are enlarged lymph nodes in the mediastinum ?largest in the precarinal region measuring 2.1 cm in short axis. ?There is decreased inhomogeneous attenuation in the thyroid. ?  ?Lungs/Pleura: There is extrinsic compression of left lower lobe bronchus, possibly due to enlarged lymph nodes in the left hilum. ?There is large nodular infiltrate measuring approximally 6.6 x 4.8 cm in the left lower lobe. There is linear density in the lingula with 12 mm nodularity in the lateral aspect. Centrilobular emphysema is seen. In the image 31 of series 4, there is 6 mm faint ?ground-glass nodular density in the left upper lobe. Small patchy infiltrate is seen in the posterolateral aspect of right lower lobe. ?Increased interstitial markings are seen in the periphery of lower lung fields, more so in the right lower lung fields with subpleural honeycombing. In the image 78 of series 4, there is 8 mm nodule in ?the anterior segment of right upper lobe inseparable from minor fissure. There is possible minimal left pleural effusion. There is no pneumothorax. ?  ?Upper Abdomen: There are few small calcific densities in the visualized portions of both kidneys, possibly calcifications in the renal artery branches or small renal stones. There is prominence of pelvocaliceal system in the left kidney. ?  ?Musculoskeletal: There are patchy areas of sclerosis in the manubrium sternum few right ribs and multiple thoracic and upper lumbar vertebral bodies. ?  ?IMPRESSION: ?There is 6.6 cm nodular infiltrate in the left lower lobe. ?Possibility of underlying primary malignant neoplasm is not excluded. There  are enlarged lymph nodes in the left hilum causing extrinsic compression of left lower lobe bronchus. Follow-up PET-CT and biopsy as warranted should be considered. ?  ?There are abnormally enlarged lymph nodes in the mediastinum suggesting metastatic lymphadenopathy. ?  ?There are multiple sclerotic lesions of varying sizes in the bony structures suggesting skeletal metastatic disease. ?  ?There is 12 mm nodular density in the lingula which may suggest atelectasis or metastatic disease. There are few other subcentimeter nodular densities in both lungs as described in the body of the report which may be incidental granulomas or neoplastic process. ?  ?COPD. Interstitial lung disease, more prominent in the periphery of both lower lung fields. Cardiomegaly. Coronary artery calcifications are seen. ?  ?Tobacco/Marijuana/Snuff/ETOH use: smokes tobacco/marijuana, no snuff or alcohol use. ? ?Past/Anticipated interventions by cardiothoracic surgery, if any:  ? ?Past/Anticipated interventions by medical oncology, if any: NA ? ?Signs/Symptoms ?Weight changes, if any: Loss 32-30 pounds ?Respiratory complaints, if any: Mild SOB, ?Hemoptysis, if any: No ?Pain issues, if any:  7/10 ?Extremity weakness or numbness, if YJE:HUDJSHFW both arms and legs ?Bowel or bladder complaints, if any:  No ? ?SAFETY ISSUES: ?Prior radiation?  No ?Pacemaker/ICD?  No ?Possible current pregnancy? Male ?Is the patient on methotrexate? No ? ?Current Complaints / other details:    ?

## 2022-01-28 ENCOUNTER — Encounter: Payer: Self-pay | Admitting: *Deleted

## 2022-01-28 ENCOUNTER — Telehealth: Payer: Self-pay | Admitting: Internal Medicine

## 2022-01-28 DIAGNOSIS — C7989 Secondary malignant neoplasm of other specified sites: Secondary | ICD-10-CM

## 2022-01-28 NOTE — Progress Notes (Signed)
Oncology Nurse Navigator Documentation ? ? ?  01/28/2022  ?  2:00 PM 01/24/2022  ?  8:00 AM  ?Oncology Nurse Navigator Flowsheets  ?Abnormal Finding Date  01/22/2022  ?Confirmed Diagnosis Date  01/23/2022  ?Navigator Follow Up Date: 02/06/2022   ?Navigator Follow Up Reason: New Patient Appointment   ?Navigator Location [facility]  ?Referral Date to RadOnc/MedOnc  01/23/2022  ?Navigator Encounter Type Other: Other:  ?Treatment Phase  Abnormal Scans  ?Barriers/Navigation Needs Coordination of Care/I updated new patient coordinator to call and schedule patient to be seen with Dr. Julien Nordmann on 4/13 with labs  Coordination of Care  ?Interventions Coordination of Care Coordination of Care  ?Acuity Level 2-Minimal Needs (1-2 Barriers Identified) Level 3-Moderate Needs (3-4 Barriers Identified)  ?Coordination of Care Other Other  ?Time Spent with Patient 30 30  ?  ?

## 2022-01-28 NOTE — Telephone Encounter (Signed)
Scheduled appt per 4/4 IB msg from [employer]. Called pt, no answer. Left msg with appt date and time. Requested for pt to call back to confirm appt.  ?

## 2022-01-29 DIAGNOSIS — C3432 Malignant neoplasm of lower lobe, left bronchus or lung: Secondary | ICD-10-CM | POA: Diagnosis not present

## 2022-01-29 DIAGNOSIS — C3490 Malignant neoplasm of unspecified part of unspecified bronchus or lung: Secondary | ICD-10-CM | POA: Diagnosis not present

## 2022-01-29 DIAGNOSIS — C7951 Secondary malignant neoplasm of bone: Secondary | ICD-10-CM | POA: Diagnosis not present

## 2022-01-29 NOTE — Progress Notes (Signed)
?  Radiation Oncology         [phone] ?________________________________ ? ?Name: DENARIO BAGOT MRN: [id number]  ?Date: 01/30/2022  DOB: 1951-01-01 ? ?SIMULATION AND TREATMENT PLANNING NOTE ? ?  ICD-10-CM   ?1. Primary cancer of left lower lobe of lung (HCC)  C34.32   ?  ?2. 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  ? ? ?DIAGNOSIS:  71 y.o. patient with thoracic and lumbar spinal metastasis from left lower lung cancer ? ?NARRATIVE:  The patient was brought to the [facility].  Identity was confirmed.  All relevant records and images related to the planned course of therapy were reviewed.  The patient freely provided informed written consent to proceed with treatment after reviewing the details related to the planned course of therapy. The consent form was witnessed and verified by the simulation staff.  Then, the patient was set-up in a stable reproducible  supine position for radiation therapy.  CT images were obtained.  Surface markings were placed.  The CT images were loaded into the planning software.  Then the target and avoidance structures were contoured including kidneys.  Treatment planning then occurred.  The radiation prescription was entered and confirmed.  Then, I designed and supervised the construction of a total of 3 medically necessary complex treatment devices with VacLoc positioner and 2 MLCs to shield kidneys.  I have requested : 3D Simulation  I have requested a DVH of the following structures: Left Kidney, Right Kidney and target. ? ?PLAN:  The patient will receive 30 Gy in 10 fractions T11-L2. ? ?________________________________ ? ?Sheral Apley Tammi Klippel, M.D. ? ?

## 2022-01-30 ENCOUNTER — Ambulatory Visit
Admission: RE | Admit: 2022-01-30 | Discharge: 2022-01-30 | Disposition: A | Discharge status: Home / Self Care | Payer: Medicare HMO | Source: Ambulatory Visit | Attending: Radiation Oncology | Admitting: Radiation Oncology

## 2022-01-30 ENCOUNTER — Telehealth: Payer: Self-pay | Admitting: *Deleted

## 2022-01-30 ENCOUNTER — Other Ambulatory Visit: Payer: Self-pay | Admitting: *Deleted

## 2022-01-30 ENCOUNTER — Other Ambulatory Visit: Payer: Self-pay

## 2022-01-30 ENCOUNTER — Other Ambulatory Visit: Payer: Self-pay | Admitting: Urology

## 2022-01-30 VITALS — BP 125/84 | HR 76 | Temp 97.9°F | Resp 20 | Ht 68.0 in | Wt 131.6 lb

## 2022-01-30 DIAGNOSIS — Z5111 Encounter for antineoplastic chemotherapy: Secondary | ICD-10-CM | POA: Insufficient documentation

## 2022-01-30 DIAGNOSIS — Z5112 Encounter for antineoplastic immunotherapy: Secondary | ICD-10-CM | POA: Insufficient documentation

## 2022-01-30 DIAGNOSIS — Z803 Family history of malignant neoplasm of breast: Secondary | ICD-10-CM | POA: Insufficient documentation

## 2022-01-30 DIAGNOSIS — J432 Centrilobular emphysema: Secondary | ICD-10-CM | POA: Diagnosis not present

## 2022-01-30 DIAGNOSIS — R197 Diarrhea, unspecified: Secondary | ICD-10-CM | POA: Diagnosis not present

## 2022-01-30 DIAGNOSIS — C3491 Malignant neoplasm of unspecified part of right bronchus or lung: Secondary | ICD-10-CM | POA: Diagnosis not present

## 2022-01-30 DIAGNOSIS — N401 Enlarged prostate with lower urinary tract symptoms: Secondary | ICD-10-CM | POA: Diagnosis not present

## 2022-01-30 DIAGNOSIS — Z510 Encounter for antineoplastic radiation therapy: Secondary | ICD-10-CM | POA: Insufficient documentation

## 2022-01-30 DIAGNOSIS — Z79899 Other long term (current) drug therapy: Secondary | ICD-10-CM | POA: Diagnosis not present

## 2022-01-30 DIAGNOSIS — I10 Essential (primary) hypertension: Secondary | ICD-10-CM | POA: Diagnosis not present

## 2022-01-30 DIAGNOSIS — R338 Other retention of urine: Secondary | ICD-10-CM | POA: Diagnosis not present

## 2022-01-30 DIAGNOSIS — C3432 Malignant neoplasm of lower lobe, left bronchus or lung: Secondary | ICD-10-CM

## 2022-01-30 DIAGNOSIS — E7800 Pure hypercholesterolemia, unspecified: Secondary | ICD-10-CM | POA: Insufficient documentation

## 2022-01-30 DIAGNOSIS — N189 Chronic kidney disease, unspecified: Secondary | ICD-10-CM | POA: Diagnosis not present

## 2022-01-30 DIAGNOSIS — Z5189 Encounter for other specified aftercare: Secondary | ICD-10-CM | POA: Diagnosis not present

## 2022-01-30 DIAGNOSIS — C799 Secondary malignant neoplasm of unspecified site: Secondary | ICD-10-CM

## 2022-01-30 DIAGNOSIS — C7951 Secondary malignant neoplasm of bone: Secondary | ICD-10-CM | POA: Insufficient documentation

## 2022-01-30 DIAGNOSIS — Z923 Personal history of irradiation: Secondary | ICD-10-CM | POA: Diagnosis not present

## 2022-01-30 DIAGNOSIS — I129 Hypertensive chronic kidney disease with stage 1 through stage 4 chronic kidney disease, or unspecified chronic kidney disease: Secondary | ICD-10-CM | POA: Insufficient documentation

## 2022-01-30 DIAGNOSIS — F17210 Nicotine dependence, cigarettes, uncomplicated: Secondary | ICD-10-CM | POA: Insufficient documentation

## 2022-01-30 DIAGNOSIS — R634 Abnormal weight loss: Secondary | ICD-10-CM | POA: Diagnosis not present

## 2022-01-30 DIAGNOSIS — C7971 Secondary malignant neoplasm of right adrenal gland: Secondary | ICD-10-CM | POA: Diagnosis not present

## 2022-01-30 NOTE — Progress Notes (Addendum)
?Radiation Oncology         [phone]) [phone] ?________________________________ ? ?Initial outpatient Consultation ? ?(Same day CT simulation) ? ? ? ?Name: [name] MRN: [id number]  ?Date of Service: 01/30/2022 DOB: 15-Aug-1951 ? ?CC:Pcp, No  Curt Bears, MD  ? ?REFERRING PHYSICIAN: Curt Bears, MD ? ?DIAGNOSIS: 71 yo man with newly diagnosed metastatic NSCLC with painful osseous disease in the spine. ? ?  ICD-10-CM   ?1. Metastatic malignant neoplasm, unspecified site [facility])  C79.9   ?  ?2. 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  ? ? ?HISTORY OF PRESENT ILLNESS: Donald Pacheco is a 71 y.o. male seen at the request of Dr. Earlie Server.  He recently presented to the emergency department on 01/22/2022 with a 2-week history of shortness of breath and low back pain.  He also mentioned approximately 20 pound weight loss over the last 2 to 3 months due to decreased appetite.  His CT chest was performed on admission and demonstrated a 6.6 cm nodular infiltrate in the left lower lobe with enlarged hilar nodes causing extrinsic compression of the bronchus with other subcentimeter nodules bilaterally as well as multiple sclerotic lesions in the sternum, right ribs, thoracic and lumbar spine, concerning for metastatic disease.  There is significant disease involvement at the level of L1 as well as a pathologic compression fracture at T12, correlating with the location of his pain.  An MRI brain was performed for disease staging purposes and was without any evidence of metastatic disease.  He had a bronchoscopy with EBUS and fine-needle aspiration of a 4R lymph node on 01/23/2022. Final surgical pathology confirmed non-small cell lung cancer.  His pain was well controlled during his hospital stay and he was able to discharge home on 01/24/2022 with plans for further work-up with medical oncology and radiation oncology on outpatient basis. ?We have been asked to consult the patient for consideration of palliative  radiotherapy to help manage pain at the sites of painful osseous disease in the thoracolumbar spine.  He is scheduled for a consult visit with Dr. Earlie Server, in medical oncology, on 02/06/2022. ? ?PREVIOUS RADIATION THERAPY: No ? ?PAST MEDICAL HISTORY:  ?Past Medical History:  ?Diagnosis Date  ? HYPERCHOLESTEROLEMIA 12/24/2006  ? HYPERTENSION, BENIGN ESSENTIAL 12/11/2009  ? TOBACCO DEPENDENCE 12/24/2006  ? 40 years 1/2 PPD. 20 pack years.   ?   ? ?PAST SURGICAL HISTORY: ?Past Surgical History:  ?Procedure Laterality Date  ? FINE NEEDLE ASPIRATION  01/23/2022  ? Procedure: FINE NEEDLE ASPIRATION (FNA) EBUS;  Surgeon: Candee Furbish, MD;  Location: [facility] ENDOSCOPY;  Service: Pulmonary;;  ? VIDEO BRONCHOSCOPY WITH ENDOBRONCHIAL ULTRASOUND Right 01/23/2022  ? Procedure: VIDEO BRONCHOSCOPY WITH ENDOBRONCHIAL ULTRASOUND;  Surgeon: Candee Furbish, MD;  Location: [facility] ENDOSCOPY;  Service: Pulmonary;  Laterality: Right;  ? ? ?FAMILY HISTORY:  ?Family History  ?Problem Relation Age of Onset  ? Tuberculosis Mother   ?     mother died when he was 4.   ? Heart failure Father   ?     died from this age 19.   ? Stroke Brother   ?     51s  ? Stroke Maternal Grandmother   ? Cancer Maternal Grandmother   ?     breast cancer  ? ? ?SOCIAL HISTORY:  ?Social History  ? ?Socioeconomic History  ? Marital status: Married  ?  Spouse name: Not on file  ? Number of children: Not on file  ? Years  of education: Not on file  ? Highest education level: Not on file  ?Occupational History  ? Occupation: retired from [employer]  ?Tobacco Use  ? Smoking status: Every Day  ?  Packs/day: 0.50  ?  Years: 40.00  ?  Pack years: 20.00  ?  Types: Cigarettes  ? Smokeless tobacco: Not on file  ?Substance and Sexual Activity  ? Alcohol use: Not Currently  ?  Comment: a couple of drinks per day, thinks he may have a problem with alcohol  ? Drug use: Yes  ?  Types: Marijuana  ?  Comment: uses maybe once a week  ? Sexual activity: Yes  ?  Partners: Female  ?  Birth  control/protection: None  ?  Comment: married  ?Other Topics Concern  ? Not on file  ?Social History Narrative  ? 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   ?   ? HS graduate.   ?   ? Married with 3 kids (5 grandkids).   ? ?Social Determinants of Health  ? ?Financial Resource Strain: Not on file  ?Food Insecurity: Not on file  ?Transportation Needs: Not on file  ?Physical Activity: Not on file  ?Stress: Not on file  ?Social Connections: Not on file  ?Intimate Partner Violence: Not on file  ? ? ?ALLERGIES: Patient has no known allergies. ? ?MEDICATIONS:  ?Current Outpatient Medications  ?Medication Sig Dispense Refill  ? atorvastatin (LIPITOR) 40 MG tablet Take 1 tablet (40 mg total) by mouth daily. 30 tablet 1  ? carvedilol (COREG) 3.125 MG tablet Take 1 tablet (3.125 mg total) by mouth 2 (two) times daily with a meal. 60 tablet 1  ? isosorbide-hydrALAZINE (BIDIL) 20-37.5 MG tablet Take 1 tablet by mouth 2 (two) times daily. 60 tablet 1  ? lidocaine (LIDODERM) 5 % Place 1 patch onto the skin every 12 (twelve) hours. Apply to most painful area.a Remove & Discard patch within 12 hours or as directed by MD 20 patch 0  ? nicotine (NICODERM CQ - DOSED IN MG/24 HOURS) 14 mg/24hr patch Place 1 patch (14 mg total) onto the skin daily. 28 patch 0  ? oxyCODONE (OXY IR/ROXICODONE) 5 MG immediate release tablet Take 1 tablet (5 mg total) by mouth every 6 (six) hours as needed for moderate pain or breakthrough pain. 20 tablet 0  ? ?No current facility-administered medications for this encounter.  ? ? ?REVIEW OF SYSTEMS:  On review of systems, the patient reports that he is doing fair overall.  His breathing is significantly improved since discharge from the hospital and his pain has remained fairly well controlled with pain medications.  He continues with decreased appetite and generalized weakness but denies any chest pain, shortness of breath, cough, fevers, chills, or night sweats.  He  has had unintentional weight loss of approximately 20 pounds over the last several months.  He denies any bowel or bladder disturbances, and denies abdominal pain, nausea or vomiting.  He denies any new musculoskeletal or joint aches or pains.  The pain in his low back does not radiate into the lower extremities and he denies any paresthesias or focal weakness.  A complete review of systems is obtained and is otherwise negative. ? ?  ?PHYSICAL EXAM:  ?Wt Readings from Last 3 Encounters:  ?01/30/22 131 lb 9.6 oz (59.7 kg)  ?01/23/22 130 lb 11.7 oz (59.3 kg)  ?06/10/13 153 lb (69.4 kg)  ? ?Temp Readings from Last 3 Encounters:  ?  01/30/22 97.9 ?F (36.6 ?C)  ?01/24/22 98.6 ?F (37 ?C) (Oral)  ?07/28/14 98.8 ?F (37.1 ?C) (Oral)  ? ?BP Readings from Last 3 Encounters:  ?01/30/22 125/84  ?01/24/22 (!) 155/102  ?07/28/14 141/80  ? ?Pulse Readings from Last 3 Encounters:  ?01/30/22 76  ?01/24/22 71  ?07/28/14 101  ? ?Pain Assessment ?Pain Score: 7  ?Pain Loc: Back (lower)/10 ? ?In general this is a well appearing African-American male in no acute distress.  He's alert and oriented x4 and appropriate throughout the examination. Cardiopulmonary assessment is negative for acute distress and he exhibits normal effort.  ? ? ?KPS = 90 ? ?100 - Normal; no complaints; no evidence of disease. ?90   - Able to carry on normal activity; minor signs or symptoms of disease. ?80   - Normal activity with effort; some signs or symptoms of disease. ?26   - Cares for self; unable to carry on normal activity or to do active work. ?60   - Requires occasional assistance, but is able to care for most of his personal needs. ?50   - Requires considerable assistance and frequent medical care. ?5   - Disabled; requires special care and assistance. ?30   - Severely disabled; hospital admission is indicated although death not imminent. ?20   - Very sick; hospital admission necessary; active supportive treatment necessary. ?10   - Moribund; fatal  processes progressing rapidly. ?0     - Dead ? ?Karnofsky DA, Abelmann WH, Craver LS and Burchenal [facility] [phone]) The use of the nitrogen mustards in the palliative treatment of carcinoma: with particular reference

## 2022-01-30 NOTE — Telephone Encounter (Signed)
CALLED PATIENT'S SON- Donald Pacheco AND INFORMED OF PET SCAN FOR 01-31-22- ARRIVAL TIME- 1:30 PM @ [facility], PATIENT TO HAVE WATER ONLY- 6 HRS. PRIOR TO TEST, SPOKE WITH PATIENT'S SON- Donald Pacheco AND HE AGREED TO HIS DAD HAVING THIS TEST ?

## 2022-01-30 NOTE — Telephone Encounter (Signed)
XXXX 

## 2022-01-30 NOTE — Progress Notes (Signed)
The proposed treatment discussed in cancer conference is for discussion purpose only and not a binding recommendation. The patient was not physically examined nor present for their treatment options. Therefore, final treatment plans cannot be decided.  ?

## 2022-01-31 ENCOUNTER — Ambulatory Visit (HOSPITAL_COMMUNITY)
Admission: RE | Admit: 2022-01-31 | Discharge: 2022-01-31 | Disposition: A | Discharge status: Home / Self Care | Payer: Medicare HMO | Source: Ambulatory Visit | Attending: Urology | Admitting: Urology

## 2022-01-31 DIAGNOSIS — C7951 Secondary malignant neoplasm of bone: Secondary | ICD-10-CM | POA: Diagnosis not present

## 2022-01-31 DIAGNOSIS — C7971 Secondary malignant neoplasm of right adrenal gland: Secondary | ICD-10-CM | POA: Diagnosis not present

## 2022-01-31 DIAGNOSIS — I129 Hypertensive chronic kidney disease with stage 1 through stage 4 chronic kidney disease, or unspecified chronic kidney disease: Secondary | ICD-10-CM | POA: Diagnosis not present

## 2022-01-31 DIAGNOSIS — Z510 Encounter for antineoplastic radiation therapy: Secondary | ICD-10-CM | POA: Diagnosis not present

## 2022-01-31 DIAGNOSIS — R590 Localized enlarged lymph nodes: Secondary | ICD-10-CM | POA: Insufficient documentation

## 2022-01-31 DIAGNOSIS — N189 Chronic kidney disease, unspecified: Secondary | ICD-10-CM | POA: Diagnosis not present

## 2022-01-31 DIAGNOSIS — I723 Aneurysm of iliac artery: Secondary | ICD-10-CM | POA: Diagnosis not present

## 2022-01-31 DIAGNOSIS — C3432 Malignant neoplasm of lower lobe, left bronchus or lung: Secondary | ICD-10-CM

## 2022-01-31 DIAGNOSIS — I2510 Atherosclerotic heart disease of native coronary artery without angina pectoris: Secondary | ICD-10-CM | POA: Diagnosis not present

## 2022-01-31 DIAGNOSIS — C7989 Secondary malignant neoplasm of other specified sites: Secondary | ICD-10-CM | POA: Diagnosis not present

## 2022-01-31 DIAGNOSIS — Z5112 Encounter for antineoplastic immunotherapy: Secondary | ICD-10-CM | POA: Diagnosis not present

## 2022-01-31 DIAGNOSIS — C3490 Malignant neoplasm of unspecified part of unspecified bronchus or lung: Secondary | ICD-10-CM | POA: Diagnosis not present

## 2022-01-31 DIAGNOSIS — Z5111 Encounter for antineoplastic chemotherapy: Secondary | ICD-10-CM | POA: Diagnosis not present

## 2022-01-31 DIAGNOSIS — R634 Abnormal weight loss: Secondary | ICD-10-CM | POA: Diagnosis not present

## 2022-01-31 LAB — GLUCOSE, CAPILLARY: Glucose-Capillary: 109 mg/dL — ABNORMAL HIGH (ref 70–99)

## 2022-01-31 MED ORDER — FLUDEOXYGLUCOSE F - 18 (FDG) INJECTION
6.4900 | Freq: Once | INTRAVENOUS | Status: AC
  Administered 2022-01-31: 6.49 via INTRAVENOUS

## 2022-02-03 ENCOUNTER — Telehealth: Payer: Self-pay

## 2022-02-03 NOTE — Telephone Encounter (Signed)
Pts son/Caregiver, called requesting a sooner appt with Dr. Julien Nordmann (currently scheduled for Thursday 02/06/22). Unfortunately, we do not have any sooner appts at this time. ? ?Pt son is in a lot of pain and states he was given 20 tabs by the ER provider and the pt has run out of the Oxycodone. He is requesting a refill. ?

## 2022-02-04 ENCOUNTER — Telehealth: Payer: Self-pay

## 2022-02-04 ENCOUNTER — Other Ambulatory Visit: Payer: Self-pay | Admitting: Radiation Oncology

## 2022-02-04 MED ORDER — OXYCODONE HCL 5 MG PO TABS: 5.0000 mg | ORAL_TABLET | Freq: Four times a day (QID) | ORAL | 0 refills | Status: DC | PRN

## 2022-02-04 NOTE — Telephone Encounter (Signed)
RN returned call patient son about refill for pain medication was able to get refill sent to pharmacy of choice.  He was appreciative of call and no other concerns at this time. ?

## 2022-02-06 ENCOUNTER — Inpatient Hospital Stay: Payer: Medicare HMO | Attending: Internal Medicine

## 2022-02-06 ENCOUNTER — Other Ambulatory Visit: Payer: Self-pay

## 2022-02-06 ENCOUNTER — Inpatient Hospital Stay: Payer: Medicare HMO

## 2022-02-06 ENCOUNTER — Inpatient Hospital Stay (HOSPITAL_BASED_OUTPATIENT_CLINIC_OR_DEPARTMENT_OTHER): Payer: Medicare HMO | Admitting: Internal Medicine

## 2022-02-06 ENCOUNTER — Ambulatory Visit
Admission: RE | Admit: 2022-02-06 | Discharge: 2022-02-06 | Disposition: A | Discharge status: Home / Self Care | Payer: Medicare HMO | Source: Ambulatory Visit | Attending: Radiation Oncology | Admitting: Radiation Oncology

## 2022-02-06 VITALS — BP 115/81 | HR 72 | Temp 98.4°F | Resp 15 | Wt 130.4 lb

## 2022-02-06 DIAGNOSIS — Z923 Personal history of irradiation: Secondary | ICD-10-CM | POA: Insufficient documentation

## 2022-02-06 DIAGNOSIS — Z510 Encounter for antineoplastic radiation therapy: Secondary | ICD-10-CM | POA: Diagnosis not present

## 2022-02-06 DIAGNOSIS — Z5111 Encounter for antineoplastic chemotherapy: Secondary | ICD-10-CM | POA: Insufficient documentation

## 2022-02-06 DIAGNOSIS — Z79899 Other long term (current) drug therapy: Secondary | ICD-10-CM | POA: Insufficient documentation

## 2022-02-06 DIAGNOSIS — R634 Abnormal weight loss: Secondary | ICD-10-CM | POA: Diagnosis not present

## 2022-02-06 DIAGNOSIS — Z5112 Encounter for antineoplastic immunotherapy: Secondary | ICD-10-CM | POA: Diagnosis not present

## 2022-02-06 DIAGNOSIS — I129 Hypertensive chronic kidney disease with stage 1 through stage 4 chronic kidney disease, or unspecified chronic kidney disease: Secondary | ICD-10-CM | POA: Insufficient documentation

## 2022-02-06 DIAGNOSIS — C3432 Malignant neoplasm of lower lobe, left bronchus or lung: Secondary | ICD-10-CM

## 2022-02-06 DIAGNOSIS — C7971 Secondary malignant neoplasm of right adrenal gland: Secondary | ICD-10-CM | POA: Diagnosis not present

## 2022-02-06 DIAGNOSIS — C7951 Secondary malignant neoplasm of bone: Secondary | ICD-10-CM | POA: Insufficient documentation

## 2022-02-06 DIAGNOSIS — C3492 Malignant neoplasm of unspecified part of left bronchus or lung: Secondary | ICD-10-CM | POA: Diagnosis not present

## 2022-02-06 DIAGNOSIS — Z5189 Encounter for other specified aftercare: Secondary | ICD-10-CM | POA: Insufficient documentation

## 2022-02-06 DIAGNOSIS — N189 Chronic kidney disease, unspecified: Secondary | ICD-10-CM | POA: Insufficient documentation

## 2022-02-06 DIAGNOSIS — C7989 Secondary malignant neoplasm of other specified sites: Secondary | ICD-10-CM

## 2022-02-06 DIAGNOSIS — R197 Diarrhea, unspecified: Secondary | ICD-10-CM | POA: Insufficient documentation

## 2022-02-06 LAB — CBC WITH DIFFERENTIAL (CANCER CENTER ONLY)
Abs Immature Granulocytes: 0.04 10*3/uL (ref 0.00–0.07)
Basophils Absolute: 0.1 10*3/uL (ref 0.0–0.1)
Basophils Relative: 1 %
Eosinophils Absolute: 0.1 10*3/uL (ref 0.0–0.5)
Eosinophils Relative: 1 %
HCT: 39.7 % (ref 39.0–52.0)
Hemoglobin: 14.3 g/dL (ref 13.0–17.0)
Immature Granulocytes: 0 %
Lymphocytes Relative: 10 %
Lymphs Abs: 1 10*3/uL (ref 0.7–4.0)
MCH: 33 pg (ref 26.0–34.0)
MCHC: 36 g/dL (ref 30.0–36.0)
MCV: 91.7 fL (ref 80.0–100.0)
Monocytes Absolute: 0.7 10*3/uL (ref 0.1–1.0)
Monocytes Relative: 7 %
Neutro Abs: 8.1 10*3/uL — ABNORMAL HIGH (ref 1.7–7.7)
Neutrophils Relative %: 81 %
Platelet Count: 410 10*3/uL — ABNORMAL HIGH (ref 150–400)
RBC: 4.33 MIL/uL (ref 4.22–5.81)
RDW: 13.2 % (ref 11.5–15.5)
WBC Count: 9.9 10*3/uL (ref 4.0–10.5)
nRBC: 0 % (ref 0.0–0.2)

## 2022-02-06 LAB — CMP (CANCER CENTER ONLY)
ALT: 9 U/L (ref 0–44)
AST: 15 U/L (ref 15–41)
Albumin: 3.8 g/dL (ref 3.5–5.0)
Alkaline Phosphatase: 382 U/L — ABNORMAL HIGH (ref 38–126)
Anion gap: 9 (ref 5–15)
BUN: 39 mg/dL — ABNORMAL HIGH (ref 8–23)
CO2: 27 mmol/L (ref 22–32)
Calcium: 9.5 mg/dL (ref 8.9–10.3)
Chloride: 103 mmol/L (ref 98–111)
Creatinine: 2.38 mg/dL — ABNORMAL HIGH (ref 0.61–1.24)
GFR, Estimated: 29 mL/min — ABNORMAL LOW (ref >60)
Glucose, Bld: 137 mg/dL — ABNORMAL HIGH (ref 70–99)
Potassium: 4.2 mmol/L (ref 3.5–5.1)
Sodium: 139 mmol/L (ref 135–145)
Total Bilirubin: 0.5 mg/dL (ref 0.3–1.2)
Total Protein: 8.1 g/dL (ref 6.5–8.1)

## 2022-02-06 MED ORDER — CYANOCOBALAMIN 1000 MCG/ML IJ SOLN
1000.0000 ug | Freq: Once | INTRAMUSCULAR | Status: DC
  Filled 2022-02-06: qty 1

## 2022-02-06 MED ORDER — PROCHLORPERAZINE MALEATE 10 MG PO TABS: 10.0000 mg | ORAL_TABLET | Freq: Four times a day (QID) | ORAL | 0 refills | Status: AC | PRN

## 2022-02-06 MED ORDER — LIDOCAINE-PRILOCAINE 2.5-2.5 % EX CREA: TOPICAL_CREAM | CUTANEOUS | 0 refills | Status: DC

## 2022-02-06 MED ORDER — FOLIC ACID 1 MG PO TABS: 1.0000 mg | ORAL_TABLET | Freq: Every day | ORAL | 4 refills | Status: DC

## 2022-02-06 NOTE — Progress Notes (Signed)
START ON PATHWAY REGIMEN - Non-Small Cell Lung ° ° °  A cycle is every 21 days: °    Pembrolizumab  °    Pemetrexed  °    Carboplatin  ° °**Always confirm dose/schedule in your pharmacy ordering system** ° °Patient Characteristics: °Stage IV Metastatic, Nonsquamous, Molecular Analysis Completed, Molecular Alteration Present and Targeted Therapy Exhausted OR EGFR Exon 20+ or KRAS G12C+ or HER2+ Present and No Prior Chemo/Immunotherapy OR No Alteration Present, Initial  °Chemotherapy/Immunotherapy, PS = 0, 1, No Alteration Present, No Alteration Present, Candidate for Immunotherapy, PD-L1 Expression Positive 1-49% (TPS) / Negative / Not Tested / Awaiting Test Results and Immunotherapy Candidate °Therapeutic Status: Stage IV Metastatic °Histology: Nonsquamous Cell °Broad Molecular Profiling Status: Molecular Analysis Completed °Molecular Analysis Results: No Alteration Present °ECOG Performance Status: 1 °Chemotherapy/Immunotherapy Line of Therapy: Initial Chemotherapy/Immunotherapy °EGFR Exons 18-21 Mutation Testing Status: Completed and Negative °ALK Fusion/Rearrangement Testing Status: Completed and Negative °BRAF V600 Mutation Testing Status: Completed and Negative °KRAS G12C Mutation Testing Status: Completed and Negative °MET Exon 14 Mutation Testing Status: Completed and Negative °RET Fusion/Rearrangement Testing Status: Completed and Negative °HER2 Mutation Testing Status: Completed and Negative °NTRK Fusion/Rearrangement Testing Status: Completed and Negative °ROS1 Fusion/Rearrangement Testing Status: Completed and Negative °Immunotherapy Candidate Status: Candidate for Immunotherapy °PD-L1 Expression Status: Quantity Not Sufficient °Intent of Therapy: °Non-Curative / Palliative Intent, Discussed with Patient °

## 2022-02-06 NOTE — Progress Notes (Signed)
DISCONTINUE ON PATHWAY REGIMEN - Non-Small Cell Lung ? ? ?  A cycle is every 21 days: ?    Pembrolizumab  ?    Pemetrexed  ?    Carboplatin  ? ?**Always confirm dose/schedule in your pharmacy ordering system** ? ?REASON: Other Reason ?PRIOR TREATMENT: [id number]: Pembrolizumab 200 mg + Pemetrexed 500 mg/m2 + Carboplatin AUC=5 q21 Days x 4 Cycles ?TREATMENT RESPONSE: Unable to Evaluate ? ?START OFF PATHWAY REGIMEN - Non-Small Cell Lung ? ? ?[id number]:Carboplatin AUC=6 IV D1 + Paclitaxel 200 mg/m2 IV D1 + Pembrolizumab 200 mg IV D1 q21 Days x 4 Cycles: ?  A cycle is every 21 days: ?    Pembrolizumab  ?    Paclitaxel  ?    Carboplatin  ? ?**Always confirm dose/schedule in your pharmacy ordering system** ? ?Patient Characteristics: ?Stage IV Metastatic, Nonsquamous, Molecular Analysis Completed, Molecular Alteration Present and Targeted Therapy Exhausted OR EGFR Exon 20+ or KRAS G12C+ or HER2+ Present and No Prior Chemo/Immunotherapy OR No Alteration Present, Initial  ?Chemotherapy/Immunotherapy, PS = 0, 1, No Alteration Present, No Alteration Present, Candidate for Immunotherapy, PD-L1 Expression Positive 1-49% (TPS) / Negative / Not Tested / Awaiting Test Results and Immunotherapy Candidate ?Therapeutic Status: Stage IV Metastatic ?Histology: Nonsquamous Cell ?Broad Molecular Profiling Status: Molecular Analysis Completed ?Molecular Analysis Results: No Alteration Present ?ECOG Performance Status: 1 ?Chemotherapy/Immunotherapy Line of Therapy: Initial Chemotherapy/Immunotherapy ?EGFR Exons 18-21 Mutation Testing Status: Completed and Negative ?ALK Fusion/Rearrangement Testing Status: Completed and Negative ?BRAF V600 Mutation Testing Status: Completed and Negative ?KRAS G12C Mutation Testing Status: Completed and Negative ?MET Exon 14 Mutation Testing Status: Completed and Negative ?RET Fusion/Rearrangement Testing Status: Completed and Negative ?HER2 Mutation Testing Status: Completed and Negative ?NTRK  Fusion/Rearrangement Testing Status: Completed and Negative ?ROS1 Fusion/Rearrangement Testing Status: Completed and Negative ?Immunotherapy Candidate Status: Candidate for Immunotherapy ?PD-L1 Expression Status: Quantity Not Sufficient ?Intent of Therapy: ?Non-Curative / Palliative Intent, Discussed with Patient ?

## 2022-02-06 NOTE — Patient Instructions (Addendum)
Lung Cancer ?Lung cancer is an abnormal growth of cancerous cells that forms a mass (malignant tumor) in a lung. There are several types of lung cancer. The types are based on the appearance of the tumor cells. The two most common types are: ?Non-small cell lung cancer. This type of lung cancer is the most common type. Non-small cell lung cancers include squamous cell carcinoma, adenocarcinoma, and large cell carcinoma. ?Small cell lung cancer. In this type of lung cancer, abnormal cells are smaller than those of non-small cell lung cancer. Small cell lung cancer gets worse (progresses) faster than non-small cell lung cancer. ?What are the causes? ?The most common cause of lung cancer is smoking tobacco. The second most common cause is exposure to a chemical called radon. ?What increases the risk? ?You are more likely to develop this condition if: ?You smoke tobacco. ?You have been exposed to: ?Secondhand tobacco smoke. ?Radon gas. ?Uranium. ?Asbestos. ?Arsenic in drinking water. ?Air pollution and diesel exhaust. ?You have a family or personal history of lung cancer. ?You have had lung radiation therapy in the past. ?You are older than age 17. ?What are the signs or symptoms? ?In the early stages, you may not have any symptoms. As the cancer progresses, symptoms may include: ?A lasting cough, possibly with blood. ?Fatigue. ?Unexplained weight loss. ?Shortness of breath. ?High-pitched whistling sounds when you breathe, most often when you breathe out (wheezing). ?Chest pain. ?Loss of appetite. ?Symptoms of advanced lung cancer include: ?Hoarseness. ?Bone or joint pain. ?Weakness. ?Change in the structure of the fingernails (clubbing), so that the nail looks like an upside-down spoon. ?Swelling of the face or arms. ?Inability to move the face (paralysis). ?Drooping eyelids. ?How is this diagnosed? ?This condition may be diagnosed based on: ?Your symptoms and medical history. ?A physical exam. ?A chest X-ray. ?A CT  scan. ?Blood tests. ?Sputum tests. ?Removal of a sample of lung tissue (lung biopsy) for testing. ?Your cancer will be assessed (staged) to determine how severe it is and how much it has spread (metastasized). ?How is this treated? ?Treatment depends on the type and stage of your cancer. Treatment may include one or more of the following: ?Surgery to remove as much of the cancer as possible. Lymph nodes in the area may be removed and tested for cancer as well. ?Medicines that kill cancer cells (chemotherapy). ?High-energy rays that kill cancer cells (radiation therapy). ?Targeted therapy. This targets specific parts of cancer cells and the area around them to block the growth and spread of the cancer. Targeted therapy can help limit the damage to healthy cells. ?Immunotherapy. This treatment uses a person's own immune system to fight cancer by either boosting the immune system or changing how the immune system works. ?Follow these instructions at home: ? ?Do not use any products that contain nicotine or tobacco. These products include cigarettes, chewing tobacco, and vaping devices, such as e-cigarettes. If you need help quitting, ask your health care provider. ?Do not drink alcohol. ?If you are admitted to the hospital, make sure your cancer specialist (oncologist) is aware. Your cancer may affect your treatment for other conditions. ?Take over-the-counter and prescription medicines only as told by your health care provider. ?Work with your health care provider to manage any side effects of treatment. ?Keep all follow-up visits. This is important. ?Where to find support ?Consider joining a local support group for people who have been diagnosed with lung cancer. ?Where to find more information ?American Cancer Society: www.cancer.org ?[place]):  www.cancer.gov ?Contact a health care provider if you: ?Lose weight without trying. ?Have a persistent cough and wheezing. ?Feel short of breath. ?Get  tired easily. ?Have bone or joint pain. ?Have difficulty swallowing. ?Notice that your voice is changing or getting hoarse. ?Have pain that does not get better with medicine. ?Get help right away if you: ?Cough up blood. ?Have chest pain or new breathing problems. ?Have a fever. ?Have swelling in an ankle, leg, or arm, or the face or neck. ?Have paralysis in your face. ?Are very confused. ?Have a drooping eyelid. ?These symptoms may represent a serious problem that is an emergency. Do not wait to see if the symptoms will go away. Get medical help right away. Call your local emergency services (911 in the [place] Do not drive yourself to the hospital. ?Summary ?Lung cancer is an abnormal growth of cancerous cells that forms a mass (malignant tumor) in a lung. ?There are several types of lung cancer. The types are based on the appearance of the tumor cells. The two most common types are non-small cell and small cell. ?The most common cause of lung cancer is smoking tobacco. ?Early symptoms include a lasting cough, possibly with blood, and fatigue, unexplained weight loss, and shortness of breath. ?After diagnosis, treatment depends on the type and stage of your cancer. ?This information is not intended to replace advice given to you by your health care provider. Make sure you discuss any questions you have with your health care provider. ?Document Revised: 04/03/2021 Document Reviewed: 04/03/2021 ?Elsevier Patient Education ? [place]. ?Smoking Tobacco Information, Adult ?Smoking tobacco can be harmful to your health. Tobacco contains a poisonous (toxic), colorless chemical called nicotine. Nicotine is addictive. It changes the brain and can make it hard to stop smoking. Tobacco also has other toxic chemicals that can hurt your body and raise your risk of many cancers. ?How can smoking tobacco affect me? ?Smoking tobacco puts you at risk for: ?Cancer. Smoking is most commonly associated with lung cancer, but  can also lead to cancer in other parts of the body. ?Chronic obstructive pulmonary disease (COPD). This is a long-term lung condition that makes it hard to breathe. It also gets worse over time. ?High blood pressure (hypertension), heart disease, stroke, or heart attack. ?Lung infections, such as pneumonia. ?Cataracts. This is when the lenses in the eyes become clouded. ?Digestive problems. This may include peptic ulcers, heartburn, and gastroesophageal reflux disease (GERD). ?Oral health problems, such as gum disease and tooth loss. ?Loss of taste and smell. ?Smoking can affect your appearance by causing: ?Wrinkles. ?Yellow or stained teeth, fingers, and fingernails. ?Smoking tobacco can also affect your social life, because: ?It may be challenging to find places to smoke when away from home. Many workplaces, [employer], hotels, and public places are tobacco-free. ?Smoking is expensive. This is due to the cost of tobacco and the long-term costs of treating health problems from smoking. ?Secondhand smoke may affect those around you. Secondhand smoke can cause lung cancer, breathing problems, and heart disease. Children of smokers have a higher risk for: ?Sudden infant death syndrome (SIDS). ?Ear infections. ?Lung infections. ?If you currently smoke tobacco, quitting now can help you: ?Lead a longer and healthier life. ?Look, smell, breathe, and feel better over time. ?Save money. ?Protect others from the harms of secondhand smoke. ?What actions can I take to prevent health problems? ?Quit smoking ? ?Do not start smoking. Quit if you already do. ?Make a plan to quit smoking and commit  to it. Look for programs to help you and ask your health care provider for recommendations and ideas. ?Set a date and write down all the reasons you want to quit. ?Let your friends and family know you are quitting so they can help and support you. Consider finding friends who also want to quit. It can be easier to quit with someone  else, so that you can support each other. ?Talk with your health care provider about using nicotine replacement medicines to help you quit, such as gum, lozenges, patches, sprays, or pills. ?Do not replace

## 2022-02-06 NOTE — Progress Notes (Signed)
MD decided to not give this medication after ordering it. He needed to change the treatment which did not required B12 injection. Gardiner Rhyme, RN ? ?

## 2022-02-06 NOTE — Progress Notes (Signed)
? ? Donald Pacheco ?[phone]   [phone] ? ?CONSULT NOTE ? ?REFERRING PHYSICIAN: Dr. Ina Homes ? ?REASON FOR CONSULTATION:  ?71 years old African-American male recently diagnosed with lung cancer. ? ?HPI ?Donald Pacheco is a 71 y.o. male with past medical history significant for hypertension, dyslipidemia as well as long history of smoking.  The patient mentioned that 2 weeks ago he was complaining of back pain that was getting worse over the previous few weeks.  This was also associated with cough and shortness of breath.  He presented to the emergency department at [facility] for evaluation.  Chest x-ray followed by CT scan of the chest without contrast was performed on January 22, 2022 and the scan showed a 6.6 cm nodular infiltrate in the left lower lobe with suspicious underlying primary malignant neoplasm.  There was enlarged lymph nodes in the left hilum causing extrinsic compression of the left lower lobe bronchus.  There were also abnormally enlarged lymph nodes in the mediastinum suggesting metastatic lymphadenopathy.  The scan also showed multiple sclerotic lesions of varying sizes in the bony structures suggesting skeletal metastatic disease.  There was a 1.2 cm nodular density in the lingula suspicious for atelectasis or metastatic disease.  The scan also showed few other subcentimeter nodular densities in both lungs.  On January 23, 2022 the patient underwent flexible bronchoscopy with EBUS by Dr. Tamala Julian.  The final pathology ([id number]) from the fine-needle aspiration of 4R lymph node showed malignant cells consistent with non-small cell carcinoma.  According to personal communication with the pathologist at the weekly thoracic conference it favors adenocarcinoma but not confirmed.  There was insufficient material for any additional studies.  A blood sample was sent to [facility] and that showed no actionable mutations. ?The patient had MRI of the  brain without con to on January 22, 2022 and it showed no evidence of intracranial metastasis but there was suspected bone metastasis in the C3 vertebral body.  He also had a PET scan on 01/31/2022 and it showed 5.0 cm left lower lobe lung mass with hypermetabolic consistent with the known non-small cell lung cancer.  There was left hilar and extensive mediastinal metastatic adenopathy.  There was also right adrenal gland metastasis along with left retroperitoneal, right-sided retroperitoneal intramuscular and right external iliac implant/adenopathy.  There was also diffuse largely sclerotic metastatic bone disease and possible metastasis involving the right thyroid cartilage and muscle metastasis involving the right oblique abdominal musculature's.  The patient was seen by Dr. Tammi Klippel and currently undergoing palliative radiotherapy to some of the metastatic bone lesions. ?The patient was referred to me today for evaluation and recommendation regarding treatment of his condition. ?When seen today he is feeling fine except for the mild back pain.  He also has shortness of breath at baseline increased with exertion and cough with no chest pain or hemoptysis.  He lost around 20 pounds in the last few weeks.  He denied having any current nausea, vomiting, diarrhea but has constipation secondary to oxycodone for pain management.  He denied having any headache or visual changes. ?Family history significant for mother with TB, father had congestive heart failure.  Brother had stroke and maternal grandmother had breast cancer. ?The patient is married and has 3 children 2 sons and 1 daughter.  He was accompanied today by his son Maximus.  The patient used to work for a [employer].  He has a history of smoking 1 pack/day for around 50 years  and he quit a few weeks ago.  He also has a history of alcohol drinking in the past but not recently.  He has no history of drug abuse. ? ?HPI ? ?Past Medical History:  ?Diagnosis Date   ? HYPERCHOLESTEROLEMIA 12/24/2006  ? HYPERTENSION, BENIGN ESSENTIAL 12/11/2009  ? TOBACCO DEPENDENCE 12/24/2006  ? 40 years 1/2 PPD. 20 pack years.   ? ? ?Past Surgical History:  ?Procedure Laterality Date  ? FINE NEEDLE ASPIRATION  01/23/2022  ? Procedure: FINE NEEDLE ASPIRATION (FNA) EBUS;  Surgeon: Candee Furbish, MD;  Location: [facility] ENDOSCOPY;  Service: Pulmonary;;  ? VIDEO BRONCHOSCOPY WITH ENDOBRONCHIAL ULTRASOUND Right 01/23/2022  ? Procedure: VIDEO BRONCHOSCOPY WITH ENDOBRONCHIAL ULTRASOUND;  Surgeon: Candee Furbish, MD;  Location: [facility] ENDOSCOPY;  Service: Pulmonary;  Laterality: Right;  ? ? ?Family History  ?Problem Relation Age of Onset  ? Tuberculosis Mother   ?     mother died when he was 4.   ? Heart failure Father   ?     died from this age 54.   ? Stroke Brother   ?     46s  ? Stroke Maternal Grandmother   ? Cancer Maternal Grandmother   ?     breast cancer  ? ? ?Social History ?Social History  ? ?Tobacco Use  ? Smoking status: Every Day  ?  Packs/day: 0.50  ?  Years: 40.00  ?  Pack years: 20.00  ?  Types: Cigarettes  ?Substance Use Topics  ? Alcohol use: Not Currently  ?  Comment: a couple of drinks per day, thinks he may have a problem with alcohol  ? Drug use: Yes  ?  Types: Marijuana  ?  Comment: uses maybe once a week  ? ? ?No Known Allergies ? ?Current Outpatient Medications  ?Medication Sig Dispense Refill  ? atorvastatin (LIPITOR) 40 MG tablet Take 1 tablet (40 mg total) by mouth daily. 30 tablet 1  ? carvedilol (COREG) 3.125 MG tablet Take 1 tablet (3.125 mg total) by mouth 2 (two) times daily with a meal. 60 tablet 1  ? isosorbide-hydrALAZINE (BIDIL) 20-37.5 MG tablet Take 1 tablet by mouth 2 (two) times daily. 60 tablet 1  ? lidocaine (LIDODERM) 5 % Place 1 patch onto the skin every 12 (twelve) hours. Apply to most painful area.a Remove & Discard patch within 12 hours or as directed by MD 20 patch 0  ? nicotine (NICODERM CQ - DOSED IN MG/24 HOURS) 14 mg/24hr patch Place 1 patch (14 mg total)  onto the skin daily. 28 patch 0  ? oxyCODONE (OXY IR/ROXICODONE) 5 MG immediate release tablet Take 1 tablet (5 mg total) by mouth every 6 (six) hours as needed for moderate pain or severe pain. 60 tablet 0  ? ?No current facility-administered medications for this visit.  ? ? ?Review of Systems ? ?Constitutional: positive for anorexia, fatigue, and weight loss ?Eyes: negative ?Ears, nose, mouth, throat, and face: negative ?Respiratory: positive for cough and dyspnea on exertion ?Cardiovascular: negative ?Gastrointestinal: positive for constipation ?Genitourinary:negative ?Integument/breast: negative ?Hematologic/lymphatic: negative ?Musculoskeletal:positive for back pain and bone pain ?Neurological: negative ?Behavioral/Psych: negative ?Endocrine: negative ?Allergic/Immunologic: negative ? ?Physical Exam ? ?UYQ:IHKVQ, healthy, no distress, well nourished, and well developed ?SKIN: skin color, texture, turgor are normal, no rashes or significant lesions ?HEAD: Normocephalic, No masses, lesions, tenderness or abnormalities ?EYES: normal, PERRLA, Conjunctiva are pink and non-injected ?EARS: External ears normal, Canals clear ?OROPHARYNX:no exudate, no erythema, and lips, buccal mucosa, and tongue normal  ?  NECK: supple, no adenopathy, no JVD ?LYMPH:  no palpable lymphadenopathy, no hepatosplenomegaly ?LUNGS: clear to auscultation , and palpation ?HEART: regular rate & rhythm, no murmurs, and no gallops ?ABDOMEN:abdomen soft, non-tender, normal bowel sounds, and no masses or organomegaly ?BACK: Back symmetric, no curvature., No CVA tenderness ?EXTREMITIES:no joint deformities, effusion, or inflammation, no edema  ?NEURO: alert & oriented x 3 with fluent speech, no focal motor/sensory deficits ? ?PERFORMANCE STATUS: ECOG 1 ? ?LABORATORY DATA: ?Lab Results  ?Component Value Date  ? WBC 8.8 01/24/2022  ? HGB 11.8 (L) 01/24/2022  ? HCT 32.5 (L) 01/24/2022  ? MCV 93.7 01/24/2022  ? PLT 293 01/24/2022  ? ? ?  Chemistry   ?    ?Component Value Date/Time  ? NA 139 01/24/2022 0351  ? K 5.1 01/24/2022 0351  ? CL 104 01/24/2022 0351  ? CO2 26 01/24/2022 0351  ? BUN 39 (H) 01/24/2022 0351  ? CREATININE 2.85 (H) 01/24/2022 0351  ?

## 2022-02-06 NOTE — Progress Notes (Signed)
Written information provided on  [facility] provided. Explained how to reach Dr. Worthy Flank nurse, symptom management clinic and available resources available to patients.Vitamin B12 injection not given per Dr. Julien Nordmann. ?

## 2022-02-07 ENCOUNTER — Ambulatory Visit
Admission: RE | Admit: 2022-02-07 | Discharge: 2022-02-07 | Disposition: A | Discharge status: Home / Self Care | Payer: Medicare HMO | Source: Ambulatory Visit | Attending: Radiation Oncology | Admitting: Radiation Oncology

## 2022-02-07 ENCOUNTER — Other Ambulatory Visit: Payer: Self-pay

## 2022-02-07 ENCOUNTER — Encounter: Payer: Self-pay | Admitting: *Deleted

## 2022-02-07 DIAGNOSIS — C7951 Secondary malignant neoplasm of bone: Secondary | ICD-10-CM | POA: Diagnosis not present

## 2022-02-07 DIAGNOSIS — Z5111 Encounter for antineoplastic chemotherapy: Secondary | ICD-10-CM | POA: Diagnosis not present

## 2022-02-07 DIAGNOSIS — N189 Chronic kidney disease, unspecified: Secondary | ICD-10-CM | POA: Diagnosis not present

## 2022-02-07 DIAGNOSIS — Z5112 Encounter for antineoplastic immunotherapy: Secondary | ICD-10-CM | POA: Diagnosis not present

## 2022-02-07 DIAGNOSIS — Z510 Encounter for antineoplastic radiation therapy: Secondary | ICD-10-CM | POA: Diagnosis not present

## 2022-02-07 DIAGNOSIS — R634 Abnormal weight loss: Secondary | ICD-10-CM | POA: Diagnosis not present

## 2022-02-07 DIAGNOSIS — C7971 Secondary malignant neoplasm of right adrenal gland: Secondary | ICD-10-CM | POA: Diagnosis not present

## 2022-02-07 DIAGNOSIS — C3432 Malignant neoplasm of lower lobe, left bronchus or lung: Secondary | ICD-10-CM | POA: Diagnosis not present

## 2022-02-07 DIAGNOSIS — I129 Hypertensive chronic kidney disease with stage 1 through stage 4 chronic kidney disease, or unspecified chronic kidney disease: Secondary | ICD-10-CM | POA: Diagnosis not present

## 2022-02-07 NOTE — Progress Notes (Signed)
Oncology Nurse Navigator Documentation ? ? ?  02/07/2022  ? 11:00 AM 01/28/2022  ?  2:00 PM 01/24/2022  ?  8:00 AM  ?Oncology Nurse Navigator Flowsheets  ?Abnormal Finding Date   01/22/2022  ?Confirmed Diagnosis Date   01/23/2022  ?Diagnosis Status Confirmed Diagnosis Complete    ?Planned Course of Treatment Chemotherapy;Radiation;Targeted Therapy    ?Phase of Treatment Radiation    ?Radiation Actual Start Date: 02/07/2022    ?Navigator Follow Up Date: 02/11/2022 02/06/2022   ?Navigator Follow Up Reason: Appointment Review New Patient Appointment   ?Navigator Location [facility]  ?Referral Date to RadOnc/MedOnc   01/23/2022  ?Navigator Encounter Type Clinic/MDC;Appt/Treatment Plan Review;Initial MedOnc Other: Other:  ?Treatment Initiated Date 02/07/2022    ?Patient Visit Type Initial;MedOnc;Other    ?Treatment Phase Pre-Tx/Tx Discussion  Abnormal Scans  ?Barriers/Navigation Needs Education Coordination of Care Coordination of Care  ?Education Newly Diagnosed Cancer Education;Other;Smoking cessation    ?Interventions Education;Psycho-Social Support/I spoke to Donald Pacheco and his son yesterday at their first visit to see Dr. Julien Nordmann.  According to Dr. Julien Nordmann, patient has stage 4 lung cancer and treatment plan is radiation and systemic therapy.  I helped to explain his plan of care and provide information on his AVS.  I checked today and his systemic therapy appts are not set up at this time. I will follow up on his schedule.  Coordination of Care Coordination of Care  ?Acuity Level 3-Moderate Needs (3-4 Barriers Identified) Level 2-Minimal Needs (1-2 Barriers Identified) Level 3-Moderate Needs (3-4 Barriers Identified)  ?Coordination of Care Other Other Other  ?Education Method Verbal;Other    ?Time Spent with Patient 45 30 30  ?  ?

## 2022-02-10 ENCOUNTER — Inpatient Hospital Stay: Payer: Medicare HMO

## 2022-02-10 ENCOUNTER — Other Ambulatory Visit: Payer: Self-pay

## 2022-02-10 ENCOUNTER — Other Ambulatory Visit: Payer: Self-pay | Admitting: Radiology

## 2022-02-10 ENCOUNTER — Ambulatory Visit
Admission: RE | Admit: 2022-02-10 | Discharge: 2022-02-10 | Disposition: A | Discharge status: Home / Self Care | Payer: Medicare HMO | Source: Ambulatory Visit | Attending: Radiation Oncology | Admitting: Radiation Oncology

## 2022-02-10 DIAGNOSIS — N189 Chronic kidney disease, unspecified: Secondary | ICD-10-CM | POA: Diagnosis not present

## 2022-02-10 DIAGNOSIS — C3432 Malignant neoplasm of lower lobe, left bronchus or lung: Secondary | ICD-10-CM | POA: Diagnosis not present

## 2022-02-10 DIAGNOSIS — I129 Hypertensive chronic kidney disease with stage 1 through stage 4 chronic kidney disease, or unspecified chronic kidney disease: Secondary | ICD-10-CM | POA: Diagnosis not present

## 2022-02-10 DIAGNOSIS — C7971 Secondary malignant neoplasm of right adrenal gland: Secondary | ICD-10-CM | POA: Diagnosis not present

## 2022-02-10 DIAGNOSIS — Z5112 Encounter for antineoplastic immunotherapy: Secondary | ICD-10-CM | POA: Diagnosis not present

## 2022-02-10 DIAGNOSIS — C7951 Secondary malignant neoplasm of bone: Secondary | ICD-10-CM | POA: Diagnosis not present

## 2022-02-10 DIAGNOSIS — Z510 Encounter for antineoplastic radiation therapy: Secondary | ICD-10-CM | POA: Diagnosis not present

## 2022-02-10 DIAGNOSIS — Z5111 Encounter for antineoplastic chemotherapy: Secondary | ICD-10-CM | POA: Diagnosis not present

## 2022-02-10 DIAGNOSIS — R634 Abnormal weight loss: Secondary | ICD-10-CM | POA: Diagnosis not present

## 2022-02-11 ENCOUNTER — Ambulatory Visit (HOSPITAL_COMMUNITY)
Admission: RE | Admit: 2022-02-11 | Discharge: 2022-02-11 | Disposition: A | Discharge status: Home / Self Care | Payer: Medicare HMO | Source: Ambulatory Visit | Attending: Internal Medicine | Admitting: Internal Medicine

## 2022-02-11 ENCOUNTER — Other Ambulatory Visit: Payer: Self-pay | Admitting: Internal Medicine

## 2022-02-11 ENCOUNTER — Ambulatory Visit
Admission: RE | Admit: 2022-02-11 | Discharge: 2022-02-11 | Disposition: A | Discharge status: Home / Self Care | Payer: Medicare HMO | Source: Ambulatory Visit | Attending: Internal Medicine | Admitting: Internal Medicine

## 2022-02-11 ENCOUNTER — Other Ambulatory Visit: Payer: Self-pay

## 2022-02-11 ENCOUNTER — Encounter (HOSPITAL_COMMUNITY): Payer: Self-pay

## 2022-02-11 ENCOUNTER — Ambulatory Visit
Admission: RE | Admit: 2022-02-11 | Discharge: 2022-02-11 | Disposition: A | Discharge status: Home / Self Care | Payer: Medicare HMO | Source: Ambulatory Visit | Attending: Radiation Oncology | Admitting: Radiation Oncology

## 2022-02-11 DIAGNOSIS — Z87891 Personal history of nicotine dependence: Secondary | ICD-10-CM | POA: Insufficient documentation

## 2022-02-11 DIAGNOSIS — I509 Heart failure, unspecified: Secondary | ICD-10-CM | POA: Insufficient documentation

## 2022-02-11 DIAGNOSIS — R634 Abnormal weight loss: Secondary | ICD-10-CM | POA: Diagnosis not present

## 2022-02-11 DIAGNOSIS — C7989 Secondary malignant neoplasm of other specified sites: Secondary | ICD-10-CM | POA: Insufficient documentation

## 2022-02-11 DIAGNOSIS — N189 Chronic kidney disease, unspecified: Secondary | ICD-10-CM | POA: Insufficient documentation

## 2022-02-11 DIAGNOSIS — Z5111 Encounter for antineoplastic chemotherapy: Secondary | ICD-10-CM | POA: Diagnosis not present

## 2022-02-11 DIAGNOSIS — I130 Hypertensive heart and chronic kidney disease with heart failure and stage 1 through stage 4 chronic kidney disease, or unspecified chronic kidney disease: Secondary | ICD-10-CM | POA: Insufficient documentation

## 2022-02-11 DIAGNOSIS — C771 Secondary and unspecified malignant neoplasm of intrathoracic lymph nodes: Secondary | ICD-10-CM | POA: Diagnosis not present

## 2022-02-11 DIAGNOSIS — M8458XA Pathological fracture in neoplastic disease, other specified site, initial encounter for fracture: Secondary | ICD-10-CM | POA: Diagnosis not present

## 2022-02-11 DIAGNOSIS — C3432 Malignant neoplasm of lower lobe, left bronchus or lung: Secondary | ICD-10-CM | POA: Diagnosis not present

## 2022-02-11 DIAGNOSIS — C7971 Secondary malignant neoplasm of right adrenal gland: Secondary | ICD-10-CM | POA: Diagnosis not present

## 2022-02-11 DIAGNOSIS — C7951 Secondary malignant neoplasm of bone: Secondary | ICD-10-CM | POA: Insufficient documentation

## 2022-02-11 DIAGNOSIS — Z452 Encounter for adjustment and management of vascular access device: Secondary | ICD-10-CM | POA: Diagnosis not present

## 2022-02-11 DIAGNOSIS — E785 Hyperlipidemia, unspecified: Secondary | ICD-10-CM | POA: Insufficient documentation

## 2022-02-11 DIAGNOSIS — I129 Hypertensive chronic kidney disease with stage 1 through stage 4 chronic kidney disease, or unspecified chronic kidney disease: Secondary | ICD-10-CM | POA: Diagnosis not present

## 2022-02-11 DIAGNOSIS — Z5112 Encounter for antineoplastic immunotherapy: Secondary | ICD-10-CM | POA: Diagnosis not present

## 2022-02-11 DIAGNOSIS — Z510 Encounter for antineoplastic radiation therapy: Secondary | ICD-10-CM | POA: Diagnosis not present

## 2022-02-11 HISTORY — PX: IR IMAGING GUIDED PORT INSERTION: IMG5740

## 2022-02-11 HISTORY — DX: Dyspnea, unspecified: R06.00

## 2022-02-11 HISTORY — PX: IR RADIOLOGIST EVAL & MGMT: IMG5224

## 2022-02-11 LAB — RAD ONC ARIA SESSION SUMMARY
Course Elapsed Days: 5
Plan Fractions Treated to Date: 4
Plan Prescribed Dose Per Fraction: 3 Gy
Plan Total Fractions Prescribed: 10
Plan Total Prescribed Dose: 30 Gy
Reference Point Dosage Given to Date: 12 Gy
Reference Point Session Dosage Given: 3 Gy
Session Number: 4

## 2022-02-11 MED ORDER — MIDAZOLAM HCL 2 MG/2ML IJ SOLN
INTRAMUSCULAR | Status: AC
  Filled 2022-02-11: qty 4

## 2022-02-11 MED ORDER — HEPARIN SOD (PORK) LOCK FLUSH 100 UNIT/ML IV SOLN
INTRAVENOUS | Status: AC | PRN
  Administered 2022-02-11: 500 [IU] via INTRAVENOUS

## 2022-02-11 MED ORDER — LORAZEPAM 2 MG/ML IJ SOLN: INTRAMUSCULAR | Status: AC | PRN

## 2022-02-11 MED ORDER — MIDAZOLAM HCL 2 MG/2ML IJ SOLN
INTRAMUSCULAR | Status: AC | PRN
  Administered 2022-02-11 (×2): 1 mg via INTRAVENOUS

## 2022-02-11 MED ORDER — FENTANYL CITRATE (PF) 100 MCG/2ML IJ SOLN
INTRAMUSCULAR | Status: AC | PRN
Start: 2022-02-11 — End: 2022-02-11
  Administered 2022-02-11 (×2): 50 ug via INTRAVENOUS

## 2022-02-11 MED ORDER — LIDOCAINE-EPINEPHRINE 1 %-1:100000 IJ SOLN
INTRAMUSCULAR | Status: AC | PRN
  Administered 2022-02-11: 20 mL

## 2022-02-11 MED ORDER — SODIUM CHLORIDE 0.9 % IV SOLN: INTRAVENOUS | Status: DC

## 2022-02-11 MED ORDER — HEPARIN SOD (PORK) LOCK FLUSH 100 UNIT/ML IV SOLN
INTRAVENOUS | Status: AC
  Filled 2022-02-11: qty 5

## 2022-02-11 MED ORDER — LIDOCAINE-EPINEPHRINE 1 %-1:100000 IJ SOLN
INTRAMUSCULAR | Status: AC
  Filled 2022-02-11: qty 1

## 2022-02-11 MED ORDER — FENTANYL CITRATE (PF) 100 MCG/2ML IJ SOLN
INTRAMUSCULAR | Status: AC
  Filled 2022-02-11: qty 2

## 2022-02-11 NOTE — Discharge Instructions (Signed)
Please call Interventional Radiology clinic [phone] with any questions or concerns. ? ?You may remove your dressing and shower tomorrow. ? ?DO NOT use EMLA cream on your port site for 2 weeks as this cream will remove surgical glue on your incision. ? ?Implanted Port Insertion, Care After ?This sheet gives you information about how to care for yourself after your procedure. Your health care provider may also give you more specific instructions. If you have problems or questions, contact your health care provider. ?What can I expect after the procedure? ?After the procedure, it is common to have: ?Discomfort at the port insertion site. ?Bruising on the skin over the port. This should improve over 3-4 days. ?Follow these instructions at home: ?Port care ?After your port is placed, you will get a manufacturer's information card. The card has information about your port. Keep this card with you at all times. ?Take care of the port as told by your health care provider. Ask your health care provider if you or a family member can get training for taking care of the port at home. A home health care nurse may also take care of the port. ?Make sure to remember what type of port you have. ?Incision care ?Follow instructions from your health care provider about how to take care of your port insertion site. Make sure you: ?Wash your hands with soap and water before and after you change your bandage (dressing). If soap and water are not available, use hand sanitizer. ?Change your dressing as told by your health care provider. ?Leave stitches (sutures), skin glue, or adhesive strips in place. These skin closures may need to stay in place for 2 weeks or longer. If adhesive strip edges start to loosen and curl up, you may trim the loose edges. Do not remove adhesive strips completely unless your health care provider tells you to do that. ?Check your port insertion site every day for signs of infection. Check for: ?Redness,  swelling, or pain. ?Fluid or blood. ?Warmth. ?Pus or a bad smell.  ? ?  ? ?  ?Activity ?Return to your normal activities as told by your health care provider. Ask your health care provider what activities are safe for you. ?Do not lift anything that is heavier than 10 lb (4.5 kg), or the limit that you are told, until your health care provider says that it is safe. ?General instructions ?Take over-the-counter and prescription medicines only as told by your health care provider. ?Do not take baths, swim, or use a hot tub until your health care provider approves. Ask your health care provider if you may take showers. You may only be allowed to take sponge baths. ?Do not drive for 24 hours if you were given a sedative during your procedure. ?Wear a medical alert bracelet in case of an emergency. This will tell any health care providers that you have a port. ?Keep all follow-up visits as told by your health care provider. This is important. ?Contact a health care provider if: ?You cannot flush your port with saline as directed, or you cannot draw blood from the port. ?You have a fever or chills. ?You have redness, swelling, or pain around your port insertion site. ?You have fluid or blood coming from your port insertion site. ?Your port insertion site feels warm to the touch. ?You have pus or a bad smell coming from the port insertion site. ?Get help right away if: ?You have chest pain or shortness of breath. ?You have bleeding from  your port that you cannot control. ?Summary ?Take care of the port as told by your health care provider. Keep the manufacturer's information card with you at all times. ?Change your dressing as told by your health care provider. ?Contact a health care provider if you have a fever or chills or if you have redness, swelling, or pain around your port insertion site. ?Keep all follow-up visits as told by your health care provider. ?This information is not intended to replace advice given to you  by your health care provider. Make sure you discuss any questions you have with your health care provider. ?Document Revised: 05/11/2018 Document Reviewed: 05/11/2018 ?Elsevier Patient Education ? [place]. ? ? ?Moderate Conscious Sedation, Adult, Care After ?This sheet gives you information about how to care for yourself after your procedure. Your health care provider may also give you more specific instructions. If you have problems or questions, contact your health care provider. ?What can I expect after the procedure? ?After the procedure, it is common to have: ?Sleepiness for several hours. ?Impaired judgment for several hours. ?Difficulty with balance. ?Vomiting if you eat too soon. ?Follow these instructions at home: ?For the time period you were told by your health care provider: ?Rest. ?Do not participate in activities where you could fall or become injured. ?Do not drive or use machinery. ?Do not drink alcohol. ?Do not take sleeping pills or medicines that cause drowsiness. ?Do not make important decisions or sign legal documents. ?Do not take care of children on your own.  ? ?  ? ?  ?Eating and drinking ?Follow the diet recommended by your health care provider. ?Drink enough fluid to keep your urine pale yellow. ?If you vomit: ?Drink water, juice, or soup when you can drink without vomiting. ?Make sure you have little or no nausea before eating solid foods.  ?  ?General instructions ?Take over-the-counter and prescription medicines only as told by your health care provider. ?Have a responsible adult stay with you for the time you are told. It is important to have someone help care for you until you are awake and alert. ?Do not smoke. ?Keep all follow-up visits as told by your health care provider. This is important. ?Contact a health care provider if: ?You are still sleepy or having trouble with balance after 24 hours. ?You feel light-headed. ?You keep feeling nauseous or you keep vomiting. ?You  develop a rash. ?You have a fever. ?You have redness or swelling around the IV site. ?Get help right away if: ?You have trouble breathing. ?You have new-onset confusion at home. ?Summary ?After the procedure, it is common to feel sleepy, have impaired judgment, or feel nauseous if you eat too soon. ?Rest after you get home. Know the things you should not do after the procedure. ?Follow the diet recommended by your health care provider and drink enough fluid to keep your urine pale yellow. ?Get help right away if you have trouble breathing or new-onset confusion at home. ?This information is not intended to replace advice given to you by your health care provider. Make sure you discuss any questions you have with your health care provider. ?Document Revised: 02/10/2020 Document Reviewed: 09/08/2019 ?Elsevier Patient Education ? [place].  ?

## 2022-02-11 NOTE — Consult Note (Addendum)
? ?Chief Complaint: ?Patient was seen in consultation today for Port-A-Cath placement ? ?Referring Physician(s): ?Mohamed,Mohamed ? ?Supervising Physician: Michaelle Birks ? ?Patient Status: [place] ? ?History of Present Illness: ?Donald Pacheco is a 71 y.o. male , ex-smoker, with past medical history significant for hypertension, CKD, CHF, dyslipidemia and now with newly diagnosed stage IVb non-small cell lung cancer favoring adenocarcinoma.  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  He presents today for port a cath placement to assist with treatment. ? ?Past Medical History:  ?Diagnosis Date  ? Dyspnea   ? HYPERCHOLESTEROLEMIA 12/24/2006  ? HYPERTENSION, BENIGN ESSENTIAL 12/11/2009  ? TOBACCO DEPENDENCE 12/24/2006  ? 40 years 1/2 PPD. 20 pack years.   ? ? ?Past Surgical History:  ?Procedure Laterality Date  ? FINE NEEDLE ASPIRATION  01/23/2022  ? Procedure: FINE NEEDLE ASPIRATION (FNA) EBUS;  Surgeon: Candee Furbish, MD;  Location[facility] ENDOSCOPY;  Service: Pulmonary;;  ? VIDEO BRONCHOSCOPY WITH ENDOBRONCHIAL ULTRASOUND Right 01/23/2022  ? Procedure: VIDEO BRONCHOSCOPY WITH ENDOBRONCHIAL ULTRASOUND;  Surgeon: Candee Furbish, MD;  Location: [facility] ENDOSCOPY;  Service: Pulmonary;  Laterality: Right;  ? ? ?Allergies: ?Patient has no known allergies. ? ?Medications: ?Prior to Admission medications   ?Medication Sig Start Date End Date Taking? Authorizing Provider  ?atorvastatin (LIPITOR) 40 MG tablet Take 1 tablet (40 mg total) by mouth daily. 01/24/22 01/24/23 Yes Caren Griffins, MD  ?carvedilol (COREG) 3.125 MG tablet Take 1 tablet (3.125 mg total) by mouth 2 (two) times daily with a meal. 01/24/22  Yes Gherghe, Vella Redhead, MD  ?isosorbide-hydrALAZINE (BIDIL) 20-37.5 MG tablet Take 1 tablet by mouth 2 (two) times  daily. 01/24/22  Yes Caren Griffins, MD  ?lidocaine (LIDODERM) 5 % Place 1 patch onto the skin every 12 (twelve) hours. Apply to most painful area.a Remove & Discard patch within 12 hours or as directed by MD 01/24/22 01/24/23 Yes Gherghe, Vella Redhead, MD  ?nicotine (NICODERM CQ - DOSED IN MG/24 HOURS) 14 mg/24hr patch Place 1 patch (14 mg total) onto the skin daily. 01/25/22  Yes Caren Griffins, MD  ?oxyCODONE (OXY IR/ROXICODONE) 5 MG immediate release tablet Take 1 tablet (5 mg total) by mouth every 6 (six) hours as needed for moderate pain or severe pain. 02/04/22  Yes Hayden Pedro, PA-C  ?prochlorperazine (COMPAZINE) 10 MG tablet Take 1 tablet (10 mg total) by mouth every 6 (six) hours as needed for nausea or vomiting. 02/06/22  Yes Curt Bears, MD  ?senna (SENOKOT) 8.6 MG TABS tablet Take 2 tablets by mouth daily.   Yes [provider]  ?lidocaine-prilocaine (EMLA) cream Apply to the Port-A-Cath site 30 minutes before treatment 02/06/22   Curt Bears, MD  ?  ? ?Family History  ?Problem Relation Age of Onset  ? Tuberculosis Mother   ?     mother died when he was 4.   ? Heart failure Father   ?     died from this age 78.   ? Stroke Brother   ?     72s  ? Stroke Maternal Grandmother   ? Cancer Maternal Grandmother   ?     breast cancer  ? ? ?Social History  ? ?Socioeconomic History  ? Marital status: Married  ?  Spouse name: Not on file  ?  Number of children: Not on file  ? Years of education: Not on file  ? Highest education level: Not on file  ?Occupational History  ? Occupation: retired from [employer]  ?Tobacco Use  ? Smoking status: Every Day  ?  Packs/day: 0.25  ?  Years: 40.00  ?  Pack years: 10.00  ?  Types: Cigarettes  ? Smokeless tobacco: Not on file  ?Vaping Use  ? Vaping Use: Never used  ?Substance and Sexual Activity  ? Alcohol use: Not Currently  ?  Comment: a couple of drinks per day, thinks he may have a problem with alcohol  ? Drug use: Yes  ?  Types: Marijuana  ?   Comment: uses maybe once a week  ? Sexual activity: Yes  ?  Partners: Female  ?  Birth control/protection: None  ?  Comment: married  ?Other Topics Concern  ? Not on file  ?Social History Narrative  ? 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   ?   ? HS graduate.   ?   ? Married with 3 kids (5 grandkids).   ? ?Social Determinants of Health  ? ?Financial Resource Strain: Not on file  ?Food Insecurity: Not on file  ?Transportation Needs: Not on file  ?Physical Activity: Not on file  ?Stress: Not on file  ?Social Connections: Not on file  ? ? ? ? ?Review of Systems currently denies fever, headache, chest pain, worsening dyspnea, cough, abdominal pain, nausea, vomiting or bleeding.  He does have back pain. ? ?Vital Signs: ?Vitals:  ? 02/11/22 1207 02/11/22 1208  ?BP: 135/82   ?Pulse:  (!) 57  ?Resp:  14  ?Temp:  98.3 ?F (36.8 ?C)  ?SpO2:  99%  ? ? ? ? ?Physical Exam awake, alert.  Chest clear to auscultation bilaterally.  Heart with regular rate and rhythm.  Abdomen soft, positive bowel sounds, nontender.  No lower extremity edema. ? ?Imaging: ?DG Chest 2 View ? ?Result Date: 01/22/2022 ?CLINICAL DATA:  Shortness of breath.  Coughing. EXAM: CHEST - 2 VIEW COMPARISON:  None. FINDINGS: The cardio pericardial silhouette is enlarged. Patchy airspace disease noted at the right base with retrocardiac left base collapse/consolidation. No substantial pleural effusion. The visualized bony structures of the thorax are unremarkable. IMPRESSION: 1. Dense retrocardiac consolidative opacity, likely pneumonia. As neoplasm could have a similar appearance, close follow-up recommended and CT chest may be warranted to further evaluate depending on clinical picture. 2. Patchy subtle airspace disease at the right base. Electronically Signed   By: Misty Stanley M.D.   On: 01/22/2022 10:52  ? ?CT Chest Wo Contrast ? ?Result Date: 01/22/2022 ?CLINICAL DATA:  Pneumonia EXAM: CT CHEST WITHOUT CONTRAST  TECHNIQUE: Multidetector CT imaging of the chest was performed following the standard protocol without IV contrast. RADIATION DOSE REDUCTION: This exam was performed according to the departmental dose-optimization program which includes automated exposure control, adjustment of the mA and/or kV according to patient size and/or use of iterative reconstruction technique. COMPARISON:  Chest radiographs done earlier today FINDINGS: Cardiovascular: There are scattered calcifications in the thoracic aorta. Coronary artery calcifications are seen. Heart is enlarged in size. Mediastinum/Nodes: There are enlarged lymph nodes in the mediastinum largest in the precarinal region measuring 2.1 cm in short axis. There is decreased inhomogeneous attenuation in the thyroid. Lungs/Pleura: There is extrinsic compression of left lower lobe bronchus, possibly due to enlarged lymph nodes in the left hilum. There is large nodular infiltrate  measuring approximally 6.6 x 4.8 cm in the left lower lobe. There is linear density in the lingula with 12 mm nodularity in the lateral aspect. Centrilobular emphysema is seen. In the image 31 of series 4, there is 6 mm faint ground-glass nodular density in the left upper lobe. Small patchy infiltrate is seen in the posterolateral aspect of right lower lobe. Increased interstitial markings are seen in the periphery of lower lung fields, more so in the right lower lung fields with subpleural honeycombing. In the image 78 of series 4, there is 8 mm nodule in the anterior segment of right upper lobe inseparable from minor fissure. There is possible minimal left pleural effusion. There is no pneumothorax. Upper Abdomen: There are few small calcific densities in the visualized portions of both kidneys, possibly calcifications in the renal artery branches or small renal stones. There is prominence of pelvocaliceal system in the left kidney. Musculoskeletal: There are patchy areas of sclerosis in the  manubrium sternum few right ribs and multiple thoracic and upper lumbar vertebral bodies. IMPRESSION: There is 6.6 cm nodular infiltrate in the left lower lobe. Possibility of underlying primary malignant neoplasm is

## 2022-02-11 NOTE — Procedures (Signed)
Vascular and Interventional Radiology Procedure Note ? ?Patient: Donald Pacheco ?DOB: [birth year] ?Medical Record Number: [id number] ?Note Date/Time: 02/11/22 2:10 PM  ? ?Performing Physician: Michaelle Birks, MD ?Assistant(s): None ? ?Diagnosis: Lung cancer, Left. ? ?Procedure: PORT PLACEMENT ? ?Anesthesia: Conscious Sedation ?Complications: None ?Estimated Blood Loss: Minimal ? ?Findings:  ?Successful right-sided port placement, with the tip of the catheter in the proximal right atrium. ? ?Plan: Catheter ready for use. ? ?See detailed procedure note with images in PACS. ?The patient tolerated the procedure well without incident or complication and was returned to Recovery in stable condition.  ? ? ?Michaelle Birks, MD ?[facility] ?[facility] Radiology ? ? ?Pager. [phone] ?Clinic. [phone]  ?

## 2022-02-11 NOTE — Progress Notes (Signed)
? ? ?Chief Complaint: ?Patient was seen in consultation today for symptomatic, pathologic fracture and metastatic spinal osseous lesions at the request of Mohamed,Mohamed ? ?Referring Physician(s): ?Mohamed,Mohamed ? ?History of Present Illness: ? ?Donald Pacheco is a 71 y.o. male w PMHx significant for long Hx of smoking w newly diagnosed metastatic NSCLC. Pt's Oncology care team includes Drs Julien Nordmann (Med Onc) and Tammi Klippel (Rad Onc). I recently became involved in his care from a multi disciplinary treatment discussion and was priviledged to meet him and his family when he presented for his port catheter placement on 02/11/22. Pt was accompanied by his Wife and Son.  ? ?He endorses persistent, localized pain to his mid-lower back. His imaging was remarkable for multifocal osseous metastatic lesions, and most concerning in his spine at C3, T12 with a pathologic fracture, and L1. ROS was positive for SOB and unintentional weight loss.  ? ?Review of Systems: A 12 point ROS discussed and pertinent positives are indicated in the HPI above.  All other systems are negative. ? ? ?Past Medical History:  ?Diagnosis Date  ? Dyspnea   ? HYPERCHOLESTEROLEMIA 12/24/2006  ? HYPERTENSION, BENIGN ESSENTIAL 12/11/2009  ? TOBACCO DEPENDENCE 12/24/2006  ? 40 years 1/2 PPD. 20 pack years.   ? ? ?Past Surgical History:  ?Procedure Laterality Date  ? FINE NEEDLE ASPIRATION  01/23/2022  ? Procedure: FINE NEEDLE ASPIRATION (FNA) EBUS;  Surgeon: Candee Furbish, MD;  Location: [facility] ENDOSCOPY;  Service: Pulmonary;;  ? IR IMAGING GUIDED PORT INSERTION  02/11/2022  ? IR RADIOLOGIST EVAL & MGMT  02/11/2022  ? VIDEO BRONCHOSCOPY WITH ENDOBRONCHIAL ULTRASOUND Right 01/23/2022  ? Procedure: VIDEO BRONCHOSCOPY WITH ENDOBRONCHIAL ULTRASOUND;  Surgeon: Candee Furbish, MD;  Location: [facility] ENDOSCOPY;  Service: Pulmonary;  Laterality: Right;  ? ? ?Allergies: ?Patient has no known allergies. ? ?Medications: ?Prior to Admission medications   ?Medication Sig  Start Date End Date Taking? Authorizing Provider  ?atorvastatin (LIPITOR) 40 MG tablet Take 1 tablet (40 mg total) by mouth daily. 01/24/22 01/24/23  Caren Griffins, MD  ?carvedilol (COREG) 3.125 MG tablet Take 1 tablet (3.125 mg total) by mouth 2 (two) times daily with a meal. 01/24/22   Gherghe, Vella Redhead, MD  ?isosorbide-hydrALAZINE (BIDIL) 20-37.5 MG tablet Take 1 tablet by mouth 2 (two) times daily. 01/24/22   Caren Griffins, MD  ?lidocaine (LIDODERM) 5 % Place 1 patch onto the skin every 12 (twelve) hours. Apply to most painful area.a Remove & Discard patch within 12 hours or as directed by MD 01/24/22 01/24/23  Caren Griffins, MD  ?lidocaine-prilocaine (EMLA) cream Apply to the Port-A-Cath site 30 minutes before treatment 02/06/22   Curt Bears, MD  ?nicotine (NICODERM CQ - DOSED IN MG/24 HOURS) 14 mg/24hr patch Place 1 patch (14 mg total) onto the skin daily. 01/25/22   Caren Griffins, MD  ?oxyCODONE (OXY IR/ROXICODONE) 5 MG immediate release tablet Take 1 tablet (5 mg total) by mouth every 6 (six) hours as needed for moderate pain or severe pain. 02/04/22   Hayden Pedro, PA-C  ?prochlorperazine (COMPAZINE) 10 MG tablet Take 1 tablet (10 mg total) by mouth every 6 (six) hours as needed for nausea or vomiting. 02/06/22   Curt Bears, MD  ?senna (SENOKOT) 8.6 MG TABS tablet Take 2 tablets by mouth daily.    [provider]  ?  ? ?Family History  ?Problem Relation Age of Onset  ? Tuberculosis Mother   ?     mother died when  he was 4.   ? Heart failure Father   ?     died from this age 36.   ? Stroke Brother   ?     52s  ? Stroke Maternal Grandmother   ? Cancer Maternal Grandmother   ?     breast cancer  ? ? ?Social History  ? ?Socioeconomic History  ? Marital status: Married  ?  Spouse name: Not on file  ? Number of children: Not on file  ? Years of education: Not on file  ? Highest education level: Not on file  ?Occupational History  ? Occupation: retired from [employer]   ?Tobacco Use  ? Smoking status: Every Day  ?  Packs/day: 0.25  ?  Years: 40.00  ?  Pack years: 10.00  ?  Types: Cigarettes  ? Smokeless tobacco: Not on file  ?Vaping Use  ? Vaping Use: Never used  ?Substance and Sexual Activity  ? Alcohol use: Not Currently  ?  Comment: a couple of drinks per day, thinks he may have a problem with alcohol  ? Drug use: Yes  ?  Types: Marijuana  ?  Comment: uses maybe once a week  ? Sexual activity: Yes  ?  Partners: Female  ?  Birth control/protection: None  ?  Comment: married  ?Other Topics Concern  ? Not on file  ?Social History Narrative  ? 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   ?   ? HS graduate.   ?   ? Married with 3 kids (5 grandkids).   ? ?Social Determinants of Health  ? ?Financial Resource Strain: Not on file  ?Food Insecurity: Not on file  ?Transportation Needs: Not on file  ?Physical Activity: Not on file  ?Stress: Not on file  ?Social Connections: Not on file  ? ? ?ECOG Status: ?1 - Symptomatic but completely ambulatory ? ? ?Review of Systems ?As above ? ?Vital Signs: ?There were no vitals taken for this visit. ? ?Physical Exam ?General: WN, WD, in mild discomfort ?CV: RRR on monitor ?Pulm: normal work of breathing on RA ?Abd: S, ND, NT ?MSK: Grossly normal. Point tenderness at thoracolumbar junction ?Psych: Appropriate affect. ? ?  ?Imaging: ? ?CT Chest, 01/22/22 and PET CT, 02/01/22 ?Imaging independently reviewed, demonstrating multifocal spinal lesions notably at C3, T12 pathologic fracture and mixed osseous sclerotic lesion at L1 ? ? ? ?DG Chest 2 View ? ?Result Date: 01/22/2022 ?CLINICAL DATA:  Shortness of breath.  Coughing. EXAM: CHEST - 2 VIEW COMPARISON:  None. FINDINGS: The cardio pericardial silhouette is enlarged. Patchy airspace disease noted at the right base with retrocardiac left base collapse/consolidation. No substantial pleural effusion. The visualized bony structures of the thorax are unremarkable. IMPRESSION:  1. Dense retrocardiac consolidative opacity, likely pneumonia. As neoplasm could have a similar appearance, close follow-up recommended and CT chest may be warranted to further evaluate depending on clinical picture. 2. Patchy subtle airspace disease at the right base. Electronically Signed   By: Misty Stanley M.D.   On: 01/22/2022 10:52  ? ?CT Chest Wo Contrast ? ?Result Date: 01/22/2022 ?CLINICAL DATA:  Pneumonia EXAM: CT CHEST WITHOUT CONTRAST TECHNIQUE: Multidetector CT imaging of the chest was performed following the standard protocol without IV contrast. RADIATION DOSE REDUCTION: This exam was performed according to the departmental dose-optimization program which includes automated exposure control, adjustment of the mA and/or kV according to patient size and/or use of iterative reconstruction technique. COMPARISON:  Chest radiographs done earlier today FINDINGS: Cardiovascular: There are scattered calcifications in the thoracic aorta. Coronary artery calcifications are seen. Heart is enlarged in size. Mediastinum/Nodes: There are enlarged lymph nodes in the mediastinum largest in the precarinal region measuring 2.1 cm in short axis. There is decreased inhomogeneous attenuation in the thyroid. Lungs/Pleura: There is extrinsic compression of left lower lobe bronchus, possibly due to enlarged lymph nodes in the left hilum. There is large nodular infiltrate measuring approximally 6.6 x 4.8 cm in the left lower lobe. There is linear density in the lingula with 12 mm nodularity in the lateral aspect. Centrilobular emphysema is seen. In the image 31 of series 4, there is 6 mm faint ground-glass nodular density in the left upper lobe. Small patchy infiltrate is seen in the posterolateral aspect of right lower lobe. Increased interstitial markings are seen in the periphery of lower lung fields, more so in the right lower lung fields with subpleural honeycombing. In the image 78 of series 4, there is 8 mm nodule in the  anterior segment of right upper lobe inseparable from minor fissure. There is possible minimal left pleural effusion. There is no pneumothorax. Upper Abdomen: There are few small calcific densities in the

## 2022-02-12 ENCOUNTER — Inpatient Hospital Stay: Payer: Medicare HMO

## 2022-02-12 ENCOUNTER — Other Ambulatory Visit: Payer: Self-pay

## 2022-02-12 ENCOUNTER — Other Ambulatory Visit: Payer: Self-pay | Admitting: *Deleted

## 2022-02-12 ENCOUNTER — Ambulatory Visit
Admission: RE | Admit: 2022-02-12 | Discharge: 2022-02-12 | Disposition: A | Discharge status: Home / Self Care | Payer: Medicare HMO | Source: Ambulatory Visit | Attending: Radiation Oncology | Admitting: Radiation Oncology

## 2022-02-12 ENCOUNTER — Encounter: Payer: Self-pay | Admitting: *Deleted

## 2022-02-12 ENCOUNTER — Telehealth: Payer: Medicare HMO

## 2022-02-12 ENCOUNTER — Ambulatory Visit (HOSPITAL_BASED_OUTPATIENT_CLINIC_OR_DEPARTMENT_OTHER): Payer: Medicare HMO | Admitting: Family Medicine

## 2022-02-12 VITALS — BP 138/82 | HR 64 | Temp 98.4°F | Resp 18

## 2022-02-12 DIAGNOSIS — C3432 Malignant neoplasm of lower lobe, left bronchus or lung: Secondary | ICD-10-CM

## 2022-02-12 DIAGNOSIS — C7971 Secondary malignant neoplasm of right adrenal gland: Secondary | ICD-10-CM | POA: Diagnosis not present

## 2022-02-12 DIAGNOSIS — R634 Abnormal weight loss: Secondary | ICD-10-CM | POA: Diagnosis not present

## 2022-02-12 DIAGNOSIS — Z95828 Presence of other vascular implants and grafts: Secondary | ICD-10-CM

## 2022-02-12 DIAGNOSIS — C7951 Secondary malignant neoplasm of bone: Secondary | ICD-10-CM | POA: Diagnosis not present

## 2022-02-12 DIAGNOSIS — I129 Hypertensive chronic kidney disease with stage 1 through stage 4 chronic kidney disease, or unspecified chronic kidney disease: Secondary | ICD-10-CM | POA: Diagnosis not present

## 2022-02-12 DIAGNOSIS — C3492 Malignant neoplasm of unspecified part of left bronchus or lung: Secondary | ICD-10-CM

## 2022-02-12 DIAGNOSIS — N189 Chronic kidney disease, unspecified: Secondary | ICD-10-CM | POA: Diagnosis not present

## 2022-02-12 DIAGNOSIS — Z5111 Encounter for antineoplastic chemotherapy: Secondary | ICD-10-CM | POA: Diagnosis not present

## 2022-02-12 DIAGNOSIS — Z5112 Encounter for antineoplastic immunotherapy: Secondary | ICD-10-CM | POA: Diagnosis not present

## 2022-02-12 DIAGNOSIS — Z510 Encounter for antineoplastic radiation therapy: Secondary | ICD-10-CM | POA: Diagnosis not present

## 2022-02-12 LAB — RAD ONC ARIA SESSION SUMMARY
Course Elapsed Days: 6
Plan Fractions Treated to Date: 5
Plan Prescribed Dose Per Fraction: 3 Gy
Plan Total Fractions Prescribed: 10
Plan Total Prescribed Dose: 30 Gy
Reference Point Dosage Given to Date: 15 Gy
Reference Point Session Dosage Given: 3 Gy
Session Number: 5

## 2022-02-12 LAB — CMP (CANCER CENTER ONLY)
ALT: 8 U/L (ref 0–44)
AST: 12 U/L — ABNORMAL LOW (ref 15–41)
Albumin: 3.4 g/dL — ABNORMAL LOW (ref 3.5–5.0)
Alkaline Phosphatase: 369 U/L — ABNORMAL HIGH (ref 38–126)
Anion gap: 7 (ref 5–15)
BUN: 32 mg/dL — ABNORMAL HIGH (ref 8–23)
CO2: 26 mmol/L (ref 22–32)
Calcium: 8.8 mg/dL — ABNORMAL LOW (ref 8.9–10.3)
Chloride: 105 mmol/L (ref 98–111)
Creatinine: 2.3 mg/dL — ABNORMAL HIGH (ref 0.61–1.24)
GFR, Estimated: 30 mL/min — ABNORMAL LOW (ref >60)
Glucose, Bld: 110 mg/dL — ABNORMAL HIGH (ref 70–99)
Potassium: 4.4 mmol/L (ref 3.5–5.1)
Sodium: 138 mmol/L (ref 135–145)
Total Bilirubin: 0.5 mg/dL (ref 0.3–1.2)
Total Protein: 7.2 g/dL (ref 6.5–8.1)

## 2022-02-12 LAB — CBC WITH DIFFERENTIAL (CANCER CENTER ONLY)
Abs Immature Granulocytes: 0.03 10*3/uL (ref 0.00–0.07)
Basophils Absolute: 0.1 10*3/uL (ref 0.0–0.1)
Basophils Relative: 1 %
Eosinophils Absolute: 0.2 10*3/uL (ref 0.0–0.5)
Eosinophils Relative: 3 %
HCT: 33.1 % — ABNORMAL LOW (ref 39.0–52.0)
Hemoglobin: 11.9 g/dL — ABNORMAL LOW (ref 13.0–17.0)
Immature Granulocytes: 0 %
Lymphocytes Relative: 8 %
Lymphs Abs: 0.6 10*3/uL — ABNORMAL LOW (ref 0.7–4.0)
MCH: 32.5 pg (ref 26.0–34.0)
MCHC: 36 g/dL (ref 30.0–36.0)
MCV: 90.4 fL (ref 80.0–100.0)
Monocytes Absolute: 0.7 10*3/uL (ref 0.1–1.0)
Monocytes Relative: 9 %
Neutro Abs: 6 10*3/uL (ref 1.7–7.7)
Neutrophils Relative %: 79 %
Platelet Count: 319 10*3/uL (ref 150–400)
RBC: 3.66 MIL/uL — ABNORMAL LOW (ref 4.22–5.81)
RDW: 13 % (ref 11.5–15.5)
WBC Count: 7.5 10*3/uL (ref 4.0–10.5)
nRBC: 0 % (ref 0.0–0.2)

## 2022-02-12 MED ORDER — SODIUM CHLORIDE 0.9 % IV SOLN
249.0000 mg | Freq: Once | INTRAVENOUS | Status: AC
  Administered 2022-02-12: 250 mg via INTRAVENOUS
  Filled 2022-02-12: qty 25

## 2022-02-12 MED ORDER — SODIUM CHLORIDE 0.9 % IV SOLN
150.0000 mg | Freq: Once | INTRAVENOUS | Status: AC
  Administered 2022-02-12: 150 mg via INTRAVENOUS
  Filled 2022-02-12: qty 150

## 2022-02-12 MED ORDER — DIPHENHYDRAMINE HCL 50 MG/ML IJ SOLN
50.0000 mg | Freq: Once | INTRAMUSCULAR | Status: AC
  Administered 2022-02-12: 50 mg via INTRAVENOUS
  Filled 2022-02-12: qty 1

## 2022-02-12 MED ORDER — FAMOTIDINE IN NACL 20-0.9 MG/50ML-% IV SOLN
20.0000 mg | Freq: Once | INTRAVENOUS | Status: AC
  Administered 2022-02-12: 20 mg via INTRAVENOUS
  Filled 2022-02-12: qty 50

## 2022-02-12 MED ORDER — SODIUM CHLORIDE 0.9 % IV SOLN
10.0000 mg | Freq: Once | INTRAVENOUS | Status: AC
  Administered 2022-02-12: 10 mg via INTRAVENOUS
  Filled 2022-02-12: qty 10

## 2022-02-12 MED ORDER — SODIUM CHLORIDE 0.9% FLUSH
10.0000 mL | INTRAVENOUS | Status: DC | PRN
  Administered 2022-02-12: 10 mL

## 2022-02-12 MED ORDER — SODIUM CHLORIDE 0.9% FLUSH
10.0000 mL | INTRAVENOUS | Status: AC | PRN
  Administered 2022-02-12: 10 mL

## 2022-02-12 MED ORDER — SODIUM CHLORIDE 0.9 % IV SOLN: Freq: Once | INTRAVENOUS | Status: AC

## 2022-02-12 MED ORDER — HEPARIN SOD (PORK) LOCK FLUSH 100 UNIT/ML IV SOLN
500.0000 [IU] | Freq: Once | INTRAVENOUS | Status: AC | PRN
  Administered 2022-02-12: 500 [IU]

## 2022-02-12 MED ORDER — PALONOSETRON HCL INJECTION 0.25 MG/5ML
0.2500 mg | Freq: Once | INTRAVENOUS | Status: AC
  Administered 2022-02-12: 0.25 mg via INTRAVENOUS
  Filled 2022-02-12: qty 5

## 2022-02-12 MED ORDER — SODIUM CHLORIDE 0.9 % IV SOLN
200.0000 mg | Freq: Once | INTRAVENOUS | Status: AC
  Administered 2022-02-12: 200 mg via INTRAVENOUS
  Filled 2022-02-12: qty 200

## 2022-02-12 MED ORDER — SODIUM CHLORIDE 0.9 % IV SOLN
175.0000 mg/m2 | Freq: Once | INTRAVENOUS | Status: AC
  Administered 2022-02-12: 294 mg via INTRAVENOUS
  Filled 2022-02-12: qty 49

## 2022-02-12 NOTE — Patient Instructions (Signed)
[facility]   ?Discharge Instructions: ?Thank you for choosing [facility] to provide your oncology and hematology care.  ? ?If you have a lab appointment with the [facility], please go directly to the [facility] and check in at the registration area. ?  ?Wear comfortable clothing and clothing appropriate for easy access to any Portacath or PICC line.  ? ?We strive to give you quality time with your provider. You may need to reschedule your appointment if you arrive late (15 or more minutes).  Arriving late affects you and other patients whose appointments are after yours.  Also, if you miss three or more appointments without notifying the office, you may be dismissed from the clinic at the provider?s discretion.    ?  ?For prescription refill requests, have your pharmacy contact our office and allow 72 hours for refills to be completed.   ? ?Today you received the following chemotherapy and/or immunotherapy agents: pembrolizumab, paclitaxel, and carboplatin    ?  ?To help prevent nausea and vomiting after your treatment, we encourage you to take your nausea medication as directed. ? ?BELOW ARE SYMPTOMS THAT SHOULD BE REPORTED IMMEDIATELY: ?*FEVER GREATER THAN 100.4 F (38 ?C) OR HIGHER ?*CHILLS OR SWEATING ?*NAUSEA AND VOMITING THAT IS NOT CONTROLLED WITH YOUR NAUSEA MEDICATION ?*UNUSUAL SHORTNESS OF BREATH ?*UNUSUAL BRUISING OR BLEEDING ?*URINARY PROBLEMS (pain or burning when urinating, or frequent urination) ?*BOWEL PROBLEMS (unusual diarrhea, constipation, pain near the anus) ?TENDERNESS IN MOUTH AND THROAT WITH OR WITHOUT PRESENCE OF ULCERS (sore throat, sores in mouth, or a toothache) ?UNUSUAL RASH, SWELLING OR PAIN  ?UNUSUAL VAGINAL DISCHARGE OR ITCHING  ? ?Items with * indicate a potential emergency and should be followed up as soon as possible or go to the Emergency Department if any problems should occur. ? ?Please show the CHEMOTHERAPY ALERT CARD or  IMMUNOTHERAPY ALERT CARD at check-in to the Emergency Department and triage nurse. ? ?Should you have questions after your visit or need to cancel or reschedule your appointment, please contact [facility]  Dept: [phone]  and follow the prompts.  Office hours are 8:00 a.m. to 4:30 p.m. Monday - Friday. Please note that voicemails left after 4:00 p.m. may not be returned until the following business day.  We are closed weekends and major holidays. You have access to a nurse at all times for urgent questions. Please call the main number to the clinic Dept: [phone] and follow the prompts. ? ? ?For any non-urgent questions, you may also contact your provider using MyChart. We now offer e-Visits for anyone 74 and older to request care online for non-urgent symptoms. For details visit [email or web address]. ?  ?Also download the MyChart app! Go to the app store, search "MyChart", open the app, select [facility], and log in with your MyChart username and password. ? ?Due to Covid, a mask is required upon entering the hospital/clinic. If you do not have a mask, one will be given to you upon arrival. For doctor visits, patients may have 1 support person aged 24 or older with them. For treatment visits, patients cannot have anyone with them due to current Covid guidelines and our immunocompromised population.  ? ? ?Pembrolizumab injection ?What is this medication? ?PEMBROLIZUMAB (pem broe liz ue mab) is a monoclonal antibody. It is used to treat certain types of cancer. ?This medicine may be used for other purposes; ask your health care provider or pharmacist if you have questions. ?  COMMON BRAND NAME(S): Keytruda ?What should I tell my care team before I take this medication? ?They need to know if you have any of these conditions: ?autoimmune diseases like Crohn's disease, ulcerative colitis, or lupus ?have had or planning to have an allogeneic stem cell transplant (uses someone  else's stem cells) ?history of organ transplant ?history of chest radiation ?nervous system problems like myasthenia gravis or Guillain-Barre syndrome ?an unusual or allergic reaction to pembrolizumab, other medicines, foods, dyes, or preservatives ?pregnant or trying to get pregnant ?breast-feeding ?How should I use this medication? ?This medicine is for infusion into a vein. It is given by a health care professional in a hospital or clinic setting. ?A special MedGuide will be given to you before each treatment. Be sure to read this information carefully each time. ?Talk to your pediatrician regarding the use of this medicine in children. While this drug may be prescribed for children as young as 6 months for selected conditions, precautions do apply. ?Overdosage: If you think you have taken too much of this medicine contact a poison control center or emergency room at once. ?NOTE: This medicine is only for you. Do not share this medicine with others. ?What if I miss a dose? ?It is important not to miss your dose. Call your doctor or health care professional if you are unable to keep an appointment. ?What may interact with this medication? ?Interactions have not been studied. ?This list may not describe all possible interactions. Give your health care provider a list of all the medicines, herbs, non-prescription drugs, or dietary supplements you use. Also tell them if you smoke, drink alcohol, or use illegal drugs. Some items may interact with your medicine. ?What should I watch for while using this medication? ?Your condition will be monitored carefully while you are receiving this medicine. ?You may need blood work done while you are taking this medicine. ?Do not become pregnant while taking this medicine or for 4 months after stopping it. Women should inform their doctor if they wish to become pregnant or think they might be pregnant. There is a potential for serious side effects to an unborn child. Talk to your  health care professional or pharmacist for more information. Do not breast-feed an infant while taking this medicine or for 4 months after the last dose. ?What side effects may I notice from receiving this medication? ?Side effects that you should report to your doctor or health care professional as soon as possible: ?allergic reactions like skin rash, itching or hives, swelling of the face, lips, or tongue ?bloody or black, tarry ?breathing problems ?changes in vision ?chest pain ?chills ?confusion ?constipation ?cough ?diarrhea ?dizziness or feeling faint or lightheaded ?fast or irregular heartbeat ?fever ?flushing ?joint pain ?low blood counts - this medicine may decrease the number of white blood cells, red blood cells and platelets. You may be at increased risk for infections and bleeding. ?muscle pain ?muscle weakness ?pain, tingling, numbness in the hands or feet ?persistent headache ?redness, blistering, peeling or loosening of the skin, including inside the mouth ?signs and symptoms of high blood sugar such as dizziness; dry mouth; dry skin; fruity breath; nausea; stomach pain; increased hunger or thirst; increased urination ?signs and symptoms of kidney injury like trouble passing urine or change in the amount of urine ?signs and symptoms of liver injury like dark urine, light-colored stools, loss of appetite, nausea, right upper belly pain, yellowing of the eyes or skin ?sweating ?swollen lymph nodes ?weight loss ?Side effects that  usually do not require medical attention (report to your doctor or health care professional if they continue or are bothersome): ?decreased appetite ?hair loss ?tiredness ?This list may not describe all possible side effects. Call your doctor for medical advice about side effects. You may report side effects to FDA at 1-800-FDA-1088. ?Where should I keep my medication? ?This drug is given in a hospital or clinic and will not be stored at home. ?NOTE: This sheet is a summary. It  may not cover all possible information. If you have questions about this medicine, talk to your doctor, pharmacist, or health care provider. ?? 2023 Elsevier/Gold Standard (2021-09-13 00:00:00) ? ?Paclitaxel

## 2022-02-12 NOTE — Progress Notes (Signed)
Per Dr Julien Nordmann, ok to proceed with treatment regardless of Serum Creatinine levels. ?

## 2022-02-13 ENCOUNTER — Encounter: Payer: Self-pay | Admitting: *Deleted

## 2022-02-13 ENCOUNTER — Other Ambulatory Visit: Payer: Self-pay

## 2022-02-13 ENCOUNTER — Telehealth: Payer: Self-pay

## 2022-02-13 ENCOUNTER — Telehealth: Payer: Self-pay | Admitting: Medical Oncology

## 2022-02-13 ENCOUNTER — Telehealth: Payer: Self-pay | Admitting: Internal Medicine

## 2022-02-13 ENCOUNTER — Ambulatory Visit
Admission: RE | Admit: 2022-02-13 | Discharge: 2022-02-13 | Disposition: A | Discharge status: Home / Self Care | Payer: Medicare HMO | Source: Ambulatory Visit | Attending: Radiation Oncology | Admitting: Radiation Oncology

## 2022-02-13 DIAGNOSIS — Z5112 Encounter for antineoplastic immunotherapy: Secondary | ICD-10-CM | POA: Diagnosis not present

## 2022-02-13 DIAGNOSIS — C7951 Secondary malignant neoplasm of bone: Secondary | ICD-10-CM | POA: Diagnosis not present

## 2022-02-13 DIAGNOSIS — I129 Hypertensive chronic kidney disease with stage 1 through stage 4 chronic kidney disease, or unspecified chronic kidney disease: Secondary | ICD-10-CM | POA: Diagnosis not present

## 2022-02-13 DIAGNOSIS — R634 Abnormal weight loss: Secondary | ICD-10-CM | POA: Diagnosis not present

## 2022-02-13 DIAGNOSIS — C3432 Malignant neoplasm of lower lobe, left bronchus or lung: Secondary | ICD-10-CM | POA: Diagnosis not present

## 2022-02-13 DIAGNOSIS — C7971 Secondary malignant neoplasm of right adrenal gland: Secondary | ICD-10-CM | POA: Diagnosis not present

## 2022-02-13 DIAGNOSIS — N189 Chronic kidney disease, unspecified: Secondary | ICD-10-CM | POA: Diagnosis not present

## 2022-02-13 DIAGNOSIS — Z5111 Encounter for antineoplastic chemotherapy: Secondary | ICD-10-CM | POA: Diagnosis not present

## 2022-02-13 DIAGNOSIS — Z510 Encounter for antineoplastic radiation therapy: Secondary | ICD-10-CM | POA: Diagnosis not present

## 2022-02-13 LAB — RAD ONC ARIA SESSION SUMMARY
Course Elapsed Days: 7
Plan Fractions Treated to Date: 6
Plan Prescribed Dose Per Fraction: 3 Gy
Plan Total Fractions Prescribed: 10
Plan Total Prescribed Dose: 30 Gy
Reference Point Dosage Given to Date: 18 Gy
Reference Point Session Dosage Given: 3 Gy
Session Number: 6

## 2022-02-13 NOTE — Telephone Encounter (Signed)
Called patient multiple times regarding 04/21 appointment, left a voicemail each time. ?

## 2022-02-13 NOTE — Progress Notes (Signed)
Oncology Nurse Navigator Documentation ? ? ?  02/13/2022  ? 11:00 AM 02/07/2022  ? 11:00 AM 01/28/2022  ?  2:00 PM 01/24/2022  ?  8:00 AM  ?Oncology Nurse Navigator Flowsheets  ?Abnormal Finding Date    01/22/2022  ?Confirmed Diagnosis Date    01/23/2022  ?Diagnosis Status  Confirmed Diagnosis Complete    ?Planned Course of Treatment  Chemotherapy;Radiation;Targeted Therapy    ?Phase of Treatment Targeted Therapy Radiation    ?Chemotherapy Actual Start Date: 02/12/2022     ?Radiation Actual Start Date: 02/06/2022 02/07/2022    ?Targeted Therapy Actual Start Date: 02/12/2022     ?Navigator Follow Up Date: 02/19/2022 02/11/2022 02/06/2022   ?Navigator Follow Up Reason: Follow-up Appointment Appointment Review New Patient Appointment   ?Navigator Location [facility]  ?Referral Date to RadOnc/MedOnc    01/23/2022  ?Navigator Encounter Type Appt/Treatment Plan Review Clinic/MDC;Appt/Treatment Plan Review;Initial MedOnc Other: Other:  ?Treatment Initiated Date  02/07/2022    ?Patient Visit Type  Initial;MedOnc;Other    ?Treatment Phase Treatment Pre-Tx/Tx Discussion  Abnormal Scans  ?Barriers/Navigation Needs Coordination of Care/I followed up on Mr. Romer schedule he is set up for his concurrent chemo rad tx.   Education Coordination of Care Coordination of Care  ?Education  Newly Diagnosed Cancer Education;Other;Smoking cessation    ?Interventions Coordination of Care Education;Psycho-Social Support Coordination of Care Coordination of Care  ?Acuity Level 2-Minimal Needs (1-2 Barriers Identified) Level 3-Moderate Needs (3-4 Barriers Identified) Level 2-Minimal Needs (1-2 Barriers Identified) Level 3-Moderate Needs (3-4 Barriers Identified)  ?Coordination of Care Other Other Other Other  ?Education Method  Verbal;Other    ?Time Spent with Patient 30 45 30 30  ?  ?

## 2022-02-13 NOTE — Telephone Encounter (Signed)
-----   Message from Clyda Hurdle, RN sent at 02/12/2022  4:43 PM EDT ----- ?Regarding: 1st Time Keytruda/Taxol/Carbo-Dr Julien Nordmann ?1st Time call back ?Keytruda/Taxol/Carbo ?Dr Worthy Flank patient ? ?

## 2022-02-13 NOTE — Telephone Encounter (Signed)
LM for patient that this nurse was calling to see how they were doing after their treatment. Please call back to Dr. Mohamed's nurse at [phone] if they have any questions or concerns regarding the treatment.  

## 2022-02-13 NOTE — Telephone Encounter (Signed)
F/u chemo -Son reports his dad is doing fine today .  ?Should he take folic acid ? ?When is injection appt.? ?Does he take Claritin and tylenol before injection? ? ?Schedule message sent for injection and questions answered.  ?

## 2022-02-14 ENCOUNTER — Other Ambulatory Visit: Payer: Self-pay

## 2022-02-14 ENCOUNTER — Ambulatory Visit: Payer: Medicare HMO

## 2022-02-14 ENCOUNTER — Ambulatory Visit
Admission: RE | Admit: 2022-02-14 | Discharge: 2022-02-14 | Disposition: A | Discharge status: Home / Self Care | Payer: Medicare HMO | Source: Ambulatory Visit | Attending: Radiation Oncology | Admitting: Radiation Oncology

## 2022-02-14 ENCOUNTER — Inpatient Hospital Stay: Payer: Medicare HMO

## 2022-02-14 DIAGNOSIS — C3432 Malignant neoplasm of lower lobe, left bronchus or lung: Secondary | ICD-10-CM | POA: Diagnosis not present

## 2022-02-14 DIAGNOSIS — C7951 Secondary malignant neoplasm of bone: Secondary | ICD-10-CM | POA: Diagnosis not present

## 2022-02-14 DIAGNOSIS — Z5111 Encounter for antineoplastic chemotherapy: Secondary | ICD-10-CM | POA: Diagnosis not present

## 2022-02-14 DIAGNOSIS — Z510 Encounter for antineoplastic radiation therapy: Secondary | ICD-10-CM | POA: Diagnosis not present

## 2022-02-14 DIAGNOSIS — C7971 Secondary malignant neoplasm of right adrenal gland: Secondary | ICD-10-CM | POA: Diagnosis not present

## 2022-02-14 DIAGNOSIS — C3492 Malignant neoplasm of unspecified part of left bronchus or lung: Secondary | ICD-10-CM

## 2022-02-14 DIAGNOSIS — R634 Abnormal weight loss: Secondary | ICD-10-CM | POA: Diagnosis not present

## 2022-02-14 DIAGNOSIS — Z5112 Encounter for antineoplastic immunotherapy: Secondary | ICD-10-CM | POA: Diagnosis not present

## 2022-02-14 DIAGNOSIS — N189 Chronic kidney disease, unspecified: Secondary | ICD-10-CM | POA: Diagnosis not present

## 2022-02-14 DIAGNOSIS — I129 Hypertensive chronic kidney disease with stage 1 through stage 4 chronic kidney disease, or unspecified chronic kidney disease: Secondary | ICD-10-CM | POA: Diagnosis not present

## 2022-02-14 LAB — RAD ONC ARIA SESSION SUMMARY
Course Elapsed Days: 8
Plan Fractions Treated to Date: 7
Plan Prescribed Dose Per Fraction: 3 Gy
Plan Total Fractions Prescribed: 10
Plan Total Prescribed Dose: 30 Gy
Reference Point Dosage Given to Date: 21 Gy
Reference Point Session Dosage Given: 3 Gy
Session Number: 7

## 2022-02-14 MED ORDER — PEGFILGRASTIM-CBQV 6 MG/0.6ML ~~LOC~~ SOSY
6.0000 mg | PREFILLED_SYRINGE | Freq: Once | SUBCUTANEOUS | Status: AC
  Administered 2022-02-14: 6 mg via SUBCUTANEOUS
  Filled 2022-02-14: qty 0.6

## 2022-02-14 NOTE — Patient Instructions (Signed)

## 2022-02-17 ENCOUNTER — Other Ambulatory Visit: Payer: Self-pay

## 2022-02-17 ENCOUNTER — Ambulatory Visit
Admission: RE | Admit: 2022-02-17 | Discharge: 2022-02-17 | Disposition: A | Discharge status: Home / Self Care | Payer: Medicare HMO | Source: Ambulatory Visit | Attending: Radiation Oncology | Admitting: Radiation Oncology

## 2022-02-17 DIAGNOSIS — Z510 Encounter for antineoplastic radiation therapy: Secondary | ICD-10-CM | POA: Diagnosis not present

## 2022-02-17 DIAGNOSIS — C7971 Secondary malignant neoplasm of right adrenal gland: Secondary | ICD-10-CM | POA: Diagnosis not present

## 2022-02-17 DIAGNOSIS — R634 Abnormal weight loss: Secondary | ICD-10-CM | POA: Diagnosis not present

## 2022-02-17 DIAGNOSIS — Z5112 Encounter for antineoplastic immunotherapy: Secondary | ICD-10-CM | POA: Diagnosis not present

## 2022-02-17 DIAGNOSIS — N189 Chronic kidney disease, unspecified: Secondary | ICD-10-CM | POA: Diagnosis not present

## 2022-02-17 DIAGNOSIS — I129 Hypertensive chronic kidney disease with stage 1 through stage 4 chronic kidney disease, or unspecified chronic kidney disease: Secondary | ICD-10-CM | POA: Diagnosis not present

## 2022-02-17 DIAGNOSIS — Z5111 Encounter for antineoplastic chemotherapy: Secondary | ICD-10-CM | POA: Diagnosis not present

## 2022-02-17 DIAGNOSIS — C7951 Secondary malignant neoplasm of bone: Secondary | ICD-10-CM | POA: Diagnosis not present

## 2022-02-17 DIAGNOSIS — C3432 Malignant neoplasm of lower lobe, left bronchus or lung: Secondary | ICD-10-CM | POA: Diagnosis not present

## 2022-02-17 LAB — RAD ONC ARIA SESSION SUMMARY
Course Elapsed Days: 11
Plan Fractions Treated to Date: 8
Plan Prescribed Dose Per Fraction: 3 Gy
Plan Total Fractions Prescribed: 10
Plan Total Prescribed Dose: 30 Gy
Reference Point Dosage Given to Date: 24 Gy
Reference Point Session Dosage Given: 3 Gy
Session Number: 8

## 2022-02-17 NOTE — Progress Notes (Signed)
[facility] ?OFFICE PROGRESS NOTE ? ?[place], Blondell Reveal, MD ?[phone] ?[place] [address] ? 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 ?Molecular studies by [facility] showed no actionable mutations. ? ?PRIOR THERAPY: None ? ?CURRENT THERAPY: ?1) palliative radiation under the care of Dr. Tammi Klippel to the metastatic bone lesions. Last dose on 02/19/22.  ?2) palliative systemic chemotherapy with carboplatin for an AUC of 5, paclitaxel 175 mg per metered squared, Keytruda 200 mg IV every 3 weeks with Neulasta support.  Status post 1 cycle.  First dose of treatment was on 02/12/22 ? ?INTERVAL HISTORY: ?Donald Pacheco 71 y.o. male returns to the clinic today for a follow-up visit accompanied by his son. The patient is recently diagnosed with stage IV lung cancer.  He is currently undergoing palliative radiation under the care of Dr. Tammi Klippel.  His last dose of radiation is tentatively scheduled for today on 02/19/2022.  He met with Dr. Normajean Baxter from interventional radiology for possible percutaneous radiofrequency ablation of the painful L1 and T12 lesion.  The patient is expected to have this performed after 03/05/22. He continues to have intermittent low back pain, which is a 8/10. The pain is not constant and depends on what he is doing/activity/position. He takes oxycodone 2-3x per day. When asked if his pain is controlled and if he feels like he needs a long acting pain medication, he said he does not feel like he needs that at this time but will let us know. He also sometimes takes tylenol too.  ? ?The patient's son mentioned that the patient was considering stopping  treatment. When asked today, the patient reports that he thinks things will improve now that he completed radiation and he wishes to continue.  ? ?Otherwise, the patient underwent his first cycle of systemic chemotherapy last week and he tolerated it fairly well. He had mild controlled nausea. He also had some diarrhea. He reports his diarrhea did not exceed two bouts a day. Decreased appetite and weight loss continues to be a problem. He is drinking ensure 2-3x per day. He lost another 5 lbs or so since last being seen. He reportedly lost about 20 pounds over the last few weeks prior to his diagnosis.  He denies any recent fever, chills, or night sweats. He reports some baseline shortness of breath with exertion but mentions this is improved compared to prior.  Denies any cough, chest pain, or hemoptysis. Patient denies any headache or visual changes.  He denies any rashes or skin changes.  He is here today for evaluation of 1 week follow-up visit to manage any adverse side effects of treatment. ? ?MEDICAL HISTORY: ?Past Medical History:  ?Diagnosis Date  ? Dyspnea   ? HYPERCHOLESTEROLEMIA 12/24/2006  ? HYPERTENSION, BENIGN ESSENTIAL 12/11/2009  ? TOBACCO DEPENDENCE 12/24/2006  ? 40 years 1/2 PPD. 20 pack years.   ? ? ?ALLERGIES:  has No Known Allergies. ? ?MEDICATIONS:  ?Current Outpatient Medications  ?Medication Sig Dispense Refill  ? mirtazapine (REMERON) 7.5 MG tablet Take 1 tablet (7.5 mg total) by mouth at bedtime. 30 tablet 2  ? atorvastatin (LIPITOR) 40 MG tablet Take 1 tablet (40 mg total) by mouth daily. [place]  tablet 2  ? carvedilol (COREG) 3.125 MG tablet Take 1 tablet (3.125 mg total) by mouth 2 (two) times daily with a meal. 60 tablet 1  ? isosorbide-hydrALAZINE (BIDIL) 20-37.5 MG tablet Take 1 tablet by mouth 2 (two) times daily. 60 tablet 1  ? lidocaine (LIDODERM) 5 % Place 1 patch onto the skin every 12 (twelve) hours. Apply to most painful area.a Remove & Discard patch within 12 hours or as directed  by MD 20 patch 0  ? lidocaine-prilocaine (EMLA) cream Apply to the Port-A-Cath site 30 minutes before treatment 30 g 0  ? nicotine (NICODERM CQ - DOSED IN MG/24 HOURS) 14 mg/24hr patch Place 1 patch (14 mg total) onto the skin daily. 28 patch 0  ? oxyCODONE (OXY IR/ROXICODONE) 5 MG immediate release tablet Take 1 tablet (5 mg total) by mouth every 6 (six) hours as needed for moderate pain or severe pain. 120 tablet 0  ? prochlorperazine (COMPAZINE) 10 MG tablet Take 1 tablet (10 mg total) by mouth every 6 (six) hours as needed for nausea or vomiting. 30 tablet 0  ? senna (SENOKOT) 8.6 MG TABS tablet Take 2 tablets by mouth daily.    ? ?No current facility-administered medications for this visit.  ? ? ?SURGICAL HISTORY:  ?Past Surgical History:  ?Procedure Laterality Date  ? FINE NEEDLE ASPIRATION  01/23/2022  ? Procedure: FINE NEEDLE ASPIRATION (FNA) EBUS;  Surgeon: Candee Furbish, MD;  Location: [facility] ENDOSCOPY;  Service: Pulmonary;;  ? IR IMAGING GUIDED PORT INSERTION  02/11/2022  ? IR RADIOLOGIST EVAL & MGMT  02/11/2022  ? VIDEO BRONCHOSCOPY WITH ENDOBRONCHIAL ULTRASOUND Right 01/23/2022  ? Procedure: VIDEO BRONCHOSCOPY WITH ENDOBRONCHIAL ULTRASOUND;  Surgeon: Candee Furbish, MD;  Location: [facility] ENDOSCOPY;  Service: Pulmonary;  Laterality: Right;  ? ? ?REVIEW OF SYSTEMS:   ?Review of Systems  ?Constitutional: Positive for fatigue, weight loss, and decreased appetite. Negative for chills and fever.  ?HENT: Negative for mouth sores, nosebleeds, sore throat and trouble swallowing.   ?Eyes: Negative for eye problems and icterus.  ?Respiratory: Positive for improved but persistent dyspnea on exertion. Negative for cough, hemoptysis, and wheezing.   ?Cardiovascular: Negative for chest pain and leg swelling.  ?Gastrointestinal: Positive for mild nausea/diarrhea. Negative for abdominal pain, constipation, and vomiting.  ?Genitourinary: Negative for bladder incontinence, difficulty urinating, dysuria, frequency and hematuria.    ?Musculoskeletal: Positive for low back pain. Negative for gait problem, neck pain and neck stiffness.  ?Skin: Negative for itching and rash.  ?Neurological: Negative for dizziness, extremity weakness, gait problem, headaches, light-headedness and seizures.  ?Hematological: Negative for adenopathy. Does not bruise/bleed easily.  ?Psychiatric/Behavioral: Negative for confusion, depression and sleep disturbance. The patient is not nervous/anxious.   ? ? ?PHYSICAL EXAMINATION:  ?Blood pressure 129/78, pulse 75, temperature 97.8 ?F (36.6 ?C), temperature source Oral, resp. rate 18, weight 125 lb 11.2 oz (57 kg), SpO2 100 %. ? ?ECOG PERFORMANCE STATUS: 1-2 ? ?Physical Exam  ?Constitutional: Oriented to person, place, and time and thin appearing male and in no distress.  ?HENT:  ?Head: Normocephalic and atraumatic.  ?Mouth/Throat: Oropharynx is clear and moist. No oropharyngeal exudate.  ?Eyes: Conjunctivae are normal. Right eye exhibits no discharge. Left eye exhibits no discharge. No scleral icterus.  ?Neck: Normal range of motion. Neck supple.  ?Cardiovascular: Normal rate, regular rhythm, normal heart sounds and intact distal pulses.   ?Pulmonary/Chest: Effort normal and breath sounds normal. No respiratory distress. No wheezes. No rales.  ?Abdominal: Soft. Bowel sounds are normal. Exhibits  no distension and no mass. There is no tenderness.  ?Musculoskeletal: Normal range of motion. Exhibits no edema.  ?Lymphadenopathy:  ?  No cervical adenopathy.  ?Neurological: Alert and oriented to person, place, and time. Exhibits muscle wasting. Examined in the wheelchair.  ?Skin: Skin is warm and dry. No rash noted. Not diaphoretic. No erythema. No pallor.  ?Psychiatric: Mood, memory and judgment normal.  ?Vitals reviewed. ? ?LABORATORY DATA: ?Lab Results  ?Component Value Date  ? WBC 19.1 (H) 02/19/2022  ? HGB 12.5 (L) 02/19/2022  ? HCT 34.9 (L) 02/19/2022  ? MCV 89.3 02/19/2022  ? PLT 193 02/19/2022  ? ? ?  Chemistry   ?    ?Component Value Date/Time  ? NA 134 (L) 02/19/2022 1453  ? K 4.5 02/19/2022 1453  ? CL 103 02/19/2022 1453  ? CO2 22 02/19/2022 1453  ? BUN 37 (H) 02/19/2022 1453  ? CREATININE 2.16 (H) 02/19/2022 1453  ?    ?C

## 2022-02-18 ENCOUNTER — Ambulatory Visit
Admission: RE | Admit: 2022-02-18 | Discharge: 2022-02-18 | Disposition: A | Discharge status: Home / Self Care | Payer: Medicare HMO | Source: Ambulatory Visit | Attending: Radiation Oncology | Admitting: Radiation Oncology

## 2022-02-18 ENCOUNTER — Other Ambulatory Visit: Payer: Self-pay | Admitting: Physician Assistant

## 2022-02-18 ENCOUNTER — Other Ambulatory Visit: Payer: Self-pay

## 2022-02-18 DIAGNOSIS — C7951 Secondary malignant neoplasm of bone: Secondary | ICD-10-CM | POA: Diagnosis not present

## 2022-02-18 DIAGNOSIS — Z5111 Encounter for antineoplastic chemotherapy: Secondary | ICD-10-CM | POA: Diagnosis not present

## 2022-02-18 DIAGNOSIS — Z5112 Encounter for antineoplastic immunotherapy: Secondary | ICD-10-CM | POA: Diagnosis not present

## 2022-02-18 DIAGNOSIS — R634 Abnormal weight loss: Secondary | ICD-10-CM | POA: Diagnosis not present

## 2022-02-18 DIAGNOSIS — Z510 Encounter for antineoplastic radiation therapy: Secondary | ICD-10-CM | POA: Diagnosis not present

## 2022-02-18 DIAGNOSIS — C3432 Malignant neoplasm of lower lobe, left bronchus or lung: Secondary | ICD-10-CM | POA: Diagnosis not present

## 2022-02-18 DIAGNOSIS — C7971 Secondary malignant neoplasm of right adrenal gland: Secondary | ICD-10-CM | POA: Diagnosis not present

## 2022-02-18 DIAGNOSIS — N189 Chronic kidney disease, unspecified: Secondary | ICD-10-CM | POA: Diagnosis not present

## 2022-02-18 DIAGNOSIS — C3492 Malignant neoplasm of unspecified part of left bronchus or lung: Secondary | ICD-10-CM

## 2022-02-18 DIAGNOSIS — I129 Hypertensive chronic kidney disease with stage 1 through stage 4 chronic kidney disease, or unspecified chronic kidney disease: Secondary | ICD-10-CM | POA: Diagnosis not present

## 2022-02-18 LAB — RAD ONC ARIA SESSION SUMMARY
Course Elapsed Days: 12
Plan Fractions Treated to Date: 9
Plan Prescribed Dose Per Fraction: 3 Gy
Plan Total Fractions Prescribed: 10
Plan Total Prescribed Dose: 30 Gy
Reference Point Dosage Given to Date: 27 Gy
Reference Point Session Dosage Given: 3 Gy
Session Number: 9

## 2022-02-19 ENCOUNTER — Other Ambulatory Visit: Payer: Self-pay

## 2022-02-19 ENCOUNTER — Encounter: Payer: Self-pay | Admitting: Physician Assistant

## 2022-02-19 ENCOUNTER — Encounter: Payer: Self-pay | Admitting: Radiation Oncology

## 2022-02-19 ENCOUNTER — Encounter (HOSPITAL_BASED_OUTPATIENT_CLINIC_OR_DEPARTMENT_OTHER): Payer: Self-pay | Admitting: Family Medicine

## 2022-02-19 ENCOUNTER — Ambulatory Visit: Payer: Medicare HMO | Admitting: Physician Assistant

## 2022-02-19 ENCOUNTER — Ambulatory Visit
Admission: RE | Admit: 2022-02-19 | Discharge: 2022-02-19 | Disposition: A | Discharge status: Home / Self Care | Payer: Medicare HMO | Source: Ambulatory Visit | Attending: Radiation Oncology | Admitting: Radiation Oncology

## 2022-02-19 ENCOUNTER — Telehealth (HOSPITAL_BASED_OUTPATIENT_CLINIC_OR_DEPARTMENT_OTHER): Payer: Self-pay | Admitting: Family Medicine

## 2022-02-19 ENCOUNTER — Ambulatory Visit (INDEPENDENT_AMBULATORY_CARE_PROVIDER_SITE_OTHER): Payer: Medicare HMO | Admitting: Family Medicine

## 2022-02-19 ENCOUNTER — Inpatient Hospital Stay: Payer: Medicare HMO

## 2022-02-19 ENCOUNTER — Inpatient Hospital Stay (HOSPITAL_BASED_OUTPATIENT_CLINIC_OR_DEPARTMENT_OTHER): Payer: Medicare HMO | Admitting: Physician Assistant

## 2022-02-19 VITALS — BP 129/78 | HR 75 | Temp 97.8°F | Resp 18 | Wt 125.7 lb

## 2022-02-19 VITALS — BP 114/72 | HR 80 | Temp 97.6°F | Ht 68.0 in | Wt 130.0 lb

## 2022-02-19 DIAGNOSIS — C3492 Malignant neoplasm of unspecified part of left bronchus or lung: Secondary | ICD-10-CM

## 2022-02-19 DIAGNOSIS — C7951 Secondary malignant neoplasm of bone: Secondary | ICD-10-CM | POA: Diagnosis not present

## 2022-02-19 DIAGNOSIS — Z510 Encounter for antineoplastic radiation therapy: Secondary | ICD-10-CM | POA: Diagnosis not present

## 2022-02-19 DIAGNOSIS — Z5111 Encounter for antineoplastic chemotherapy: Secondary | ICD-10-CM | POA: Diagnosis not present

## 2022-02-19 DIAGNOSIS — C3432 Malignant neoplasm of lower lobe, left bronchus or lung: Secondary | ICD-10-CM | POA: Diagnosis not present

## 2022-02-19 DIAGNOSIS — E785 Hyperlipidemia, unspecified: Secondary | ICD-10-CM | POA: Diagnosis not present

## 2022-02-19 DIAGNOSIS — G893 Neoplasm related pain (acute) (chronic): Secondary | ICD-10-CM | POA: Diagnosis not present

## 2022-02-19 DIAGNOSIS — G8929 Other chronic pain: Secondary | ICD-10-CM | POA: Insufficient documentation

## 2022-02-19 DIAGNOSIS — N189 Chronic kidney disease, unspecified: Secondary | ICD-10-CM | POA: Diagnosis not present

## 2022-02-19 DIAGNOSIS — C7971 Secondary malignant neoplasm of right adrenal gland: Secondary | ICD-10-CM | POA: Diagnosis not present

## 2022-02-19 DIAGNOSIS — Z5112 Encounter for antineoplastic immunotherapy: Secondary | ICD-10-CM | POA: Diagnosis not present

## 2022-02-19 DIAGNOSIS — I10 Essential (primary) hypertension: Secondary | ICD-10-CM | POA: Diagnosis not present

## 2022-02-19 DIAGNOSIS — R634 Abnormal weight loss: Secondary | ICD-10-CM | POA: Diagnosis not present

## 2022-02-19 DIAGNOSIS — I129 Hypertensive chronic kidney disease with stage 1 through stage 4 chronic kidney disease, or unspecified chronic kidney disease: Secondary | ICD-10-CM | POA: Diagnosis not present

## 2022-02-19 LAB — CBC WITH DIFFERENTIAL (CANCER CENTER ONLY)
Abs Immature Granulocytes: 1.42 10*3/uL — ABNORMAL HIGH (ref 0.00–0.07)
Basophils Absolute: 0.1 10*3/uL (ref 0.0–0.1)
Basophils Relative: 0 %
Eosinophils Absolute: 0.4 10*3/uL (ref 0.0–0.5)
Eosinophils Relative: 2 %
HCT: 34.9 % — ABNORMAL LOW (ref 39.0–52.0)
Hemoglobin: 12.5 g/dL — ABNORMAL LOW (ref 13.0–17.0)
Immature Granulocytes: 7 %
Lymphocytes Relative: 3 %
Lymphs Abs: 0.6 10*3/uL — ABNORMAL LOW (ref 0.7–4.0)
MCH: 32 pg (ref 26.0–34.0)
MCHC: 35.8 g/dL (ref 30.0–36.0)
MCV: 89.3 fL (ref 80.0–100.0)
Monocytes Absolute: 1.5 10*3/uL — ABNORMAL HIGH (ref 0.1–1.0)
Monocytes Relative: 8 %
Neutro Abs: 15.2 10*3/uL — ABNORMAL HIGH (ref 1.7–7.7)
Neutrophils Relative %: 80 %
Platelet Count: 193 10*3/uL (ref 150–400)
RBC: 3.91 MIL/uL — ABNORMAL LOW (ref 4.22–5.81)
RDW: 13.3 % (ref 11.5–15.5)
Smear Review: NORMAL
WBC Count: 19.1 10*3/uL — ABNORMAL HIGH (ref 4.0–10.5)
WBC Morphology: INCREASED
nRBC: 0 % (ref 0.0–0.2)

## 2022-02-19 LAB — RAD ONC ARIA SESSION SUMMARY
Course Elapsed Days: 13
Plan Fractions Treated to Date: 10
Plan Prescribed Dose Per Fraction: 3 Gy
Plan Total Fractions Prescribed: 10
Plan Total Prescribed Dose: 30 Gy
Reference Point Dosage Given to Date: 30 Gy
Reference Point Session Dosage Given: 3 Gy
Session Number: 10

## 2022-02-19 LAB — CMP (CANCER CENTER ONLY)
ALT: 11 U/L (ref 0–44)
AST: 17 U/L (ref 15–41)
Albumin: 3.3 g/dL — ABNORMAL LOW (ref 3.5–5.0)
Alkaline Phosphatase: 321 U/L — ABNORMAL HIGH (ref 38–126)
Anion gap: 9 (ref 5–15)
BUN: 37 mg/dL — ABNORMAL HIGH (ref 8–23)
CO2: 22 mmol/L (ref 22–32)
Calcium: 8.7 mg/dL — ABNORMAL LOW (ref 8.9–10.3)
Chloride: 103 mmol/L (ref 98–111)
Creatinine: 2.16 mg/dL — ABNORMAL HIGH (ref 0.61–1.24)
GFR, Estimated: 33 mL/min — ABNORMAL LOW (ref >60)
Glucose, Bld: 140 mg/dL — ABNORMAL HIGH (ref 70–99)
Potassium: 4.5 mmol/L (ref 3.5–5.1)
Sodium: 134 mmol/L — ABNORMAL LOW (ref 135–145)
Total Bilirubin: 0.5 mg/dL (ref 0.3–1.2)
Total Protein: 7 g/dL (ref 6.5–8.1)

## 2022-02-19 LAB — TSH: TSH: 1.138 u[IU]/mL (ref 0.350–4.500)

## 2022-02-19 MED ORDER — ISOSORB DINITRATE-HYDRALAZINE 20-37.5 MG PO TABS: 1.0000 | ORAL_TABLET | Freq: Two times a day (BID) | ORAL | 1 refills | Status: DC

## 2022-02-19 MED ORDER — MIRTAZAPINE 7.5 MG PO TABS: 7.5000 mg | ORAL_TABLET | Freq: Every day | ORAL | 2 refills | Status: DC

## 2022-02-19 MED ORDER — CARVEDILOL 3.125 MG PO TABS: 3.1250 mg | ORAL_TABLET | Freq: Two times a day (BID) | ORAL | 1 refills | Status: DC

## 2022-02-19 MED ORDER — OXYCODONE HCL 5 MG PO TABS: 5.0000 mg | ORAL_TABLET | Freq: Four times a day (QID) | ORAL | 0 refills | Status: DC | PRN

## 2022-02-19 MED ORDER — MIRTAZAPINE 15 MG PO TABS: 7.5000 mg | ORAL_TABLET | Freq: Every day | ORAL | 2 refills | Status: DC

## 2022-02-19 MED ORDER — ATORVASTATIN CALCIUM 40 MG PO TABS: 40.0000 mg | ORAL_TABLET | Freq: Every day | ORAL | 2 refills | Status: DC

## 2022-02-19 NOTE — Progress Notes (Signed)
? ?New Patient Office Visit ? ?Subjective   ? ?Patient ID: Donald Pacheco, male    DOB: January 20, 1951  Age: 71 y.o. MRN: [id number] ? ?CC:  ?Chief Complaint  ?Patient presents with  ? New Patient (Initial Visit)  ? ? ?HPI ?Donald Pacheco presents to establish care.  Past medical history is significant for hypertension, hyperlipidemia, tobacco use, quit recently, recently diagnosed non-small cell lung cancer ?Last PCP -thinks this was about 12 years ago. ? ?Hypertension: Current medications include carvedilol, BiDil, requesting refill today.  Denies any current issues related to headaches. ? ?Hyperlipidemia: Reports being managed on atorvastatin, requesting refill today ? ?Non-small cell lung cancer: Diagnosis is favored to be adenocarcinoma.  He has met with oncology as well as with other specialists related to treatment.  Treatment of this time is generally being done for palliative reasons.  He is currently undergoing chemotherapy as well as radiation.  Reports his next follow-up with oncology is in a couple weeks.  He has been having some increased pain with chemotherapy and radiation treatments.  Requesting refill of oxycodone today to assist with controlling this ?He has been using heating pad to help with low back pain in particular.  He was also prescribed lidocaine patches, however did not really try these.  Does still have some at home that he intends to try ? ?Patient is originally from [place].  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 ? ?Outpatient Encounter Medications as of 02/19/2022  ?Medication Sig  ? lidocaine (LIDODERM) 5 % Place 1 patch onto the skin every 12 (twelve) hours. Apply to most painful area.a Remove & Discard patch within 12 hours or as directed by MD  ? lidocaine-prilocaine (EMLA) cream Apply to the Port-A-Cath site 30 minutes before treatment  ? nicotine (NICODERM CQ - DOSED IN MG/24 HOURS) 14 mg/24hr patch Place 1 patch (14 mg total) onto the skin daily.   ? prochlorperazine (COMPAZINE) 10 MG tablet Take 1 tablet (10 mg total) by mouth every 6 (six) hours as needed for nausea or vomiting.  ? senna (SENOKOT) 8.6 MG TABS tablet Take 2 tablets by mouth daily.  ? [DISCONTINUED] atorvastatin (LIPITOR) 40 MG tablet Take 1 tablet (40 mg total) by mouth daily.  ? [DISCONTINUED] carvedilol (COREG) 3.125 MG tablet Take 1 tablet (3.125 mg total) by mouth 2 (two) times daily with a meal.  ? [DISCONTINUED] isosorbide-hydrALAZINE (BIDIL) 20-37.5 MG tablet Take 1 tablet by mouth 2 (two) times daily.  ? [DISCONTINUED] oxyCODONE (OXY IR/ROXICODONE) 5 MG immediate release tablet Take 1 tablet (5 mg total) by mouth every 6 (six) hours as needed for moderate pain or severe pain.  ? atorvastatin (LIPITOR) 40 MG tablet Take 1 tablet (40 mg total) by mouth daily.  ? carvedilol (COREG) 3.125 MG tablet Take 1 tablet (3.125 mg total) by mouth 2 (two) times daily with a meal.  ? isosorbide-hydrALAZINE (BIDIL) 20-37.5 MG tablet Take 1 tablet by mouth 2 (two) times daily.  ? oxyCODONE (OXY IR/ROXICODONE) 5 MG immediate release tablet Take 1 tablet (5 mg total) by mouth every 6 (six) hours as needed for moderate pain or severe pain.  ? ?No facility-administered encounter medications on file as of 02/19/2022.  ? ? ?Past Medical History:  ?Diagnosis Date  ? Dyspnea   ? HYPERCHOLESTEROLEMIA 12/24/2006  ? HYPERTENSION, BENIGN ESSENTIAL 12/11/2009  ? TOBACCO DEPENDENCE 12/24/2006  ? 40 years 1/2 PPD. 20 pack years.   ? ? ?Past Surgical History:  ?  Procedure Laterality Date  ? FINE NEEDLE ASPIRATION  01/23/2022  ? Procedure: FINE NEEDLE ASPIRATION (FNA) EBUS;  Surgeon: Candee Furbish, MD;  Location[facility] ENDOSCOPY;  Service: Pulmonary;;  ? IR IMAGING GUIDED PORT INSERTION  02/11/2022  ? IR RADIOLOGIST EVAL & MGMT  02/11/2022  ? VIDEO BRONCHOSCOPY WITH ENDOBRONCHIAL ULTRASOUND Right 01/23/2022  ? Procedure: VIDEO BRONCHOSCOPY WITH ENDOBRONCHIAL ULTRASOUND;  Surgeon: Candee Furbish, MD;  Location: [facility]  ENDOSCOPY;  Service: Pulmonary;  Laterality: Right;  ? ? ?Family History  ?Problem Relation Age of Onset  ? Tuberculosis Mother   ?     mother died when he was 4.   ? Heart failure Father   ?     died from this age 62.   ? Stroke Brother   ?     2s  ? Stroke Maternal Grandmother   ? Cancer Maternal Grandmother   ?     breast cancer  ? ? ?Social History  ? ?Socioeconomic History  ? Marital status: Married  ?  Spouse name: Not on file  ? Number of children: Not on file  ? Years of education: Not on file  ? Highest education level: Not on file  ?Occupational History  ? Occupation: retired from [employer]  ?Tobacco Use  ? Smoking status: Every Day  ?  Packs/day: 0.25  ?  Years: 40.00  ?  Pack years: 10.00  ?  Types: Cigarettes  ? Smokeless tobacco: Not on file  ?Vaping Use  ? Vaping Use: Never used  ?Substance and Sexual Activity  ? Alcohol use: Not Currently  ?  Comment: a couple of drinks per day, thinks he may have a problem with alcohol  ? Drug use: Yes  ?  Types: Marijuana  ?  Comment: uses maybe once a week  ? Sexual activity: Yes  ?  Partners: Female  ?  Birth control/protection: None  ?  Comment: married  ?Other Topics Concern  ? Not on file  ?Social History Narrative  ? 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   ?   ? HS graduate.   ?   ? Married with 3 kids (5 grandkids).   ? ?Social Determinants of Health  ? ?Financial Resource Strain: Not on file  ?Food Insecurity: Not on file  ?Transportation Needs: Not on file  ?Physical Activity: Not on file  ?Stress: Not on file  ?Social Connections: Not on file  ?Intimate Partner Violence: Not on file  ? ? ?Objective   ? ?BP 114/72   Pulse 80   Temp 97.6 ?F (36.4 ?C) (Oral)   Ht 5' 8"  (1.727 m)   Wt 130 lb (59 kg)   SpO2 97%   BMI 19.77 kg/m?  ? ?Physical Exam ? ?71 year old male in no acute distress ?Cardiovascular exam regular rate and rhythm ?Lungs with expiratory crackles throughout, adequate airflow ? ?Assessment &  Plan:  ? ?Problem List Items Addressed This Visit   ? ?  ? Cardiovascular and Mediastinum  ? HYPERTENSION, BENIGN ESSENTIAL - Primary  ?  Blood pressure at goal in office today ?Can continue with carvedilol and BiDil, refill sent to pharmacy on file.  Will need to monitor closely for any potential changes and need for medication adjustments, particularly due to underlying stage IV cancer and use of chemotherapy ?Recommend intermittent monitoring of blood pressure at home ? ?  ?  ? Relevant Medications  ? atorvastatin (LIPITOR) 40 MG tablet  ?  carvedilol (COREG) 3.125 MG tablet  ? isosorbide-hydrALAZINE (BIDIL) 20-37.5 MG tablet  ?  ? Other  ? Chronic pain  ?  Patient with chronic pain related to underlying cancer and chemotherapy/radiation treatments ?Reviewed PDMP today, no red flags, refilled oxycodone today ?Can continue with Tylenol as needed, can use topical treatments such as heating pad, can consider use of lidocaine patches at home and if these are found to be beneficial, we can provide refills for this ? ?  ?  ? Relevant Medications  ? oxyCODONE (OXY IR/ROXICODONE) 5 MG immediate release tablet  ? ? ?Return in about 6 weeks (around 04/02/2022).  ? ?Dong J [place], MD ? ? ?

## 2022-02-19 NOTE — Assessment & Plan Note (Signed)
Can continue with atorvastatin, refill provided ?May be a medication that can be discontinued in the future due to underlying stage IV diagnosis ?

## 2022-02-19 NOTE — Assessment & Plan Note (Signed)
Patient with chronic pain related to underlying cancer and chemotherapy/radiation treatments ?Reviewed PDMP today, no red flags, refilled oxycodone today ?Can continue with Tylenol as needed, can use topical treatments such as heating pad, can consider use of lidocaine patches at home and if these are found to be beneficial, we can provide refills for this ?

## 2022-02-19 NOTE — Telephone Encounter (Signed)
Spoke to patient's son on 4/26.  He states that when he contacted pt's pharmacy, they informed him that a prior auth was needed for isosorbide-hydrALAZINE (BIDIL) 20-37.5 MG tablet that was prescribed by PCP. Told caller that when it does come in, it might take a couple days for completion and to get approved by insurance. ?

## 2022-02-19 NOTE — Progress Notes (Deleted)
Patient ID: Donald Pacheco, male   DOB: Nov 25, 1950, 71 y.o.   MRN: [id number] ? ? ?After hospitalization  Admit date: 01/22/2022 ?Discharge date: 01/24/2022 ?  ?Admitted From: home ?Disposition:  home ?  ?Recommendations for Outpatient Follow-up:  ?Follow up with PCP in 1-2 weeks ?Please obtain BMP/CBC in one week ?Please follow up on the following pending results: ?  ?Home Health: PT ?Equipment/Devices: none ?  ?Discharge Condition: stable ?CODE STATUS: DNR ?Diet recommendation: heart healthy ?  ?HPI: Per admitting MD, ?Donald Pacheco FORGET is a 71 y.o. male with medical history significant of HTN, HLD, and tobacco dependence presenting with back pain x 2 weeks.  He reports that for a couple of weeks he was having back pain in his R flank.  He has also been having SOB for about the same period of time.  The pain is worse with coughing.  The cough is also new and is nonproductive.  The pain is also worse when he first gets up but eases up with movement.  SOB is worse with exertion.  +weight loss, at least 20 pounds - usual weight was maybe 165-170.  +night sweats, not new but worse in the last 2 weeks. He last saw a doctor in maybe 2011 or 2012.  He continues to smoke, 0.5 ppd for 30+ years.   ?  ?Hospital Course / Discharge diagnoses: ?Principal Problem: ?  Back pain ?Active Problems: ?  Metastatic malignant neoplasm ([place]) ?  Renal dysfunction ?  HYPERCHOLESTEROLEMIA ?  TOBACCO DEPENDENCE ?  HYPERTENSION, BENIGN ESSENTIAL ?  Acute hyperglycemia ?  DNR (do not resuscitate) ?  CKD (chronic kidney disease) stage 4, GFR 15-29 ml/min (HCC) ?  Chronic systolic CHF (congestive heart failure) ([place]) ?  ?  ?Assessment and Plan: ?Principal problem ?Back pain due to stage IV metastatic malignant neoplasm, likely lung primary -CT scan on admission with a 6.6 cm nodule infiltrate in the left lower lobe, as well as hilar and mediastinal lymphadenopathy causing extrinsic compression of the left lower lobe bronchus, also with several  areas of concern for bone metastasis.  Pulmonary consulted, he underwent EBUS on 3/30, with biopsy.  Pulmonary will arrange outpatient oncology follow-up likely next week once biopsies will be available.  An MRI of the brain was negative for intracranial metastasis.  He was also placed on oxycodone due to severe back pain ?  ?Active problems ?Acute kidney injury, suspicion for CKD 4-patient's most recent creatinine was 2011, 1.1.  On admission his creatinine was 3.2, received fluids and decreasing to 2.6, suggesting a component of dehydration/prerenal/acute kidney injury.  Creatinine has stabilized around 2.8 at the time of discharge.  Suspect this is his baseline.  [facility] contacted, they will arrange outpatient follow-up. Renal ultrasound with medical renal disease, indicating chronic kidney disease  ?Diffuse atherosclerosis-CT scan showed possible calcification in the renal artery branches, thoracic aorta calcifications as well as coronary artery calcifications.  This will likely be followed up as an outpatient by vascular  ?Chronic systolic CHF, mild -2D echo was done shows slightly depressed EF at 40-45% with global hypokinesis.  He also has grade 1 diastolic dysfunction.  RV was normal.  No significant valvular pathology.  Case was discussed with Dr. Terrence Dupont, patient was placed on BiDil as well as Coreg per his recommendations, and will see him as an outpatient in 2 days on Monday.  He has no chest pain or anginal type symptoms ?Dyspnea on exertion -progressive, suspect multifactorial related to his cancer,  chronic systolic CHF, deconditioning with weight loss ?Acute hyperglycemia -on admission, resolved, A1c 5.0, he is not a diabetic ?Essential hypertension-placed on BiDil and Coreg as above.  Dose to be uptitrated as an outpatient as indicated  ?Tobacco use-recommend that he quit, he will be prescribed nicotine patch ?Hyperlipidemia-placed on statin on discharge  ?Suppressed TSH-at 0.26, free T4  within normal limits.  Recommend recheck TSH and free T3 and T4 in 3 to 4 weeks. ?  ?Sepsis ruled out ? ? ?From 02/06/2022 oncology appt: ?ASSESSMENT: This is a very pleasant 71 years old African-American male recently diagnosed with a stage IVb (T3, N2, M1 C) non-small cell lung cancer favoring adenocarcinoma but not confirmed diagnosed in March 2023 and presented with large left lower lobe lung mass in addition to left hilar and mediastinal lymphadenopathy as well as metastatic disease to the right adrenal gland in addition to bone including C3 lesion and intramuscular metastasis. ?Molecular studies by [facility] showed no actionable mutations. ?  ?PLAN: I had a lengthy discussion with the patient and his son today about his current disease stage, prognosis and treatment options.  I personally and independently reviewed the scan images and discussed the result and showed the images to the patient and his son. ?I explained to the patient that he has incurable condition and all the treatment will be of palliative nature.  I explained to the patient his prognosis with and without treatment. ?He was given the option of palliative care and hospice versus consideration of palliative systemic chemotherapy with carboplatin for AUC of 5, paclitaxel 175 Mg/M2 and Keytruda 200 Mg IV every 3 weeks with Neulasta support.  The patient is not a candidate for treatment with Alimta secondary to the significant renal insufficiency beside he is not completely confirmed to be adenocarcinoma. ?The patient and his son are interested in proceeding with systemic chemotherapy.  I discussed with him the adverse effect of this treatment including but not limited to alopecia, myelosuppression, nausea and vomiting, peripheral neuropathy, liver or renal dysfunction. ?He is expected to start the first cycle of this treatment next week. ?I will arrange for the patient to have a chemotherapy education class before the first dose of his  treatment ?For the metastatic bone lesion, he is currently undergoing palliative radiotherapy to this lesion under the care of Dr. Tammi Klippel. ?I will also refer the patient to Dr. Michaelle Birks for consideration of targeted percutaneous radiofrequency ablation (OsteoCool) and kyphoplasty of his symptomatic T12 and L1 lesions.  Not sure if he can help also with the C3 lesion. ?I will also refer the patient to IR for Port-A-Cath placement. ?I will call his pharmacy with prescription for Compazine 10 mg p.o. every 6 hours as needed for nausea in addition to Emla cream to be applied to the Port-A-Cath site before treatment. ?The patient will come back for follow-up visit in 2 weeks for evaluation and management of any adverse effect of his treatment. ?He was advised to call immediately if he has any other concerning symptoms in the interval. ?The patient voices understanding of current disease status and treatment options and is in agreement with the current care plan. ?  ?All questions were answered. The patient knows to call the clinic with any problems, questions or concerns. We can certainly see the patient much sooner if necessary. ?  ?Thank you so much for allowing me to participate in the care of BRAVERY KETCHAM. I will continue to follow up the patient with you and assist  in his care. ?  ?The total time spent in the appointment was 90 minutes ?  ?

## 2022-02-19 NOTE — Assessment & Plan Note (Signed)
Blood pressure at goal in office today ?Can continue with carvedilol and BiDil, refill sent to pharmacy on file.  Will need to monitor closely for any potential changes and need for medication adjustments, particularly due to underlying stage IV cancer and use of chemotherapy ?Recommend intermittent monitoring of blood pressure at home ?

## 2022-02-20 DIAGNOSIS — I509 Heart failure, unspecified: Secondary | ICD-10-CM | POA: Diagnosis not present

## 2022-02-20 DIAGNOSIS — N1832 Chronic kidney disease, stage 3b: Secondary | ICD-10-CM | POA: Diagnosis not present

## 2022-02-20 DIAGNOSIS — D631 Anemia in chronic kidney disease: Secondary | ICD-10-CM | POA: Diagnosis not present

## 2022-02-20 DIAGNOSIS — I129 Hypertensive chronic kidney disease with stage 1 through stage 4 chronic kidney disease, or unspecified chronic kidney disease: Secondary | ICD-10-CM | POA: Diagnosis not present

## 2022-02-20 DIAGNOSIS — N2581 Secondary hyperparathyroidism of renal origin: Secondary | ICD-10-CM | POA: Diagnosis not present

## 2022-02-20 DIAGNOSIS — C3490 Malignant neoplasm of unspecified part of unspecified bronchus or lung: Secondary | ICD-10-CM | POA: Diagnosis not present

## 2022-02-20 DIAGNOSIS — N1330 Unspecified hydronephrosis: Secondary | ICD-10-CM | POA: Diagnosis not present

## 2022-02-21 DIAGNOSIS — N1832 Chronic kidney disease, stage 3b: Secondary | ICD-10-CM | POA: Diagnosis not present

## 2022-02-23 ENCOUNTER — Encounter: Payer: Self-pay | Admitting: Internal Medicine

## 2022-02-24 ENCOUNTER — Encounter: Payer: Self-pay | Admitting: Licensed Clinical Social Worker

## 2022-02-24 ENCOUNTER — Encounter: Payer: Self-pay | Admitting: Internal Medicine

## 2022-02-24 ENCOUNTER — Telehealth: Payer: Self-pay

## 2022-02-24 ENCOUNTER — Telehealth: Payer: Self-pay | Admitting: Medical Oncology

## 2022-02-24 ENCOUNTER — Other Ambulatory Visit: Payer: Self-pay | Admitting: Physician Assistant

## 2022-02-24 DIAGNOSIS — C3492 Malignant neoplasm of unspecified part of left bronchus or lung: Secondary | ICD-10-CM

## 2022-02-24 DIAGNOSIS — N184 Chronic kidney disease, stage 4 (severe): Secondary | ICD-10-CM

## 2022-02-24 NOTE — Telephone Encounter (Signed)
I sent a mychart message that will bring pt in for an appointment . ?

## 2022-02-24 NOTE — Progress Notes (Signed)
[facility] CSW Progress Note ? ?Clinical Social Worker contacted caregiver by phone to discuss POA and Adv Dir. paperwork.  Pt's son Samael Blades, [place]) recently moved back to [place] from [place] after pt's diagnosis to assist with his care.  Per son pt's spouse has been diagnosed with dementia which appears to have progressed since the last time the son was home.  According to son his mother lost her sister and brother within the last month and a half.  Her sister was providing care for their 71 year old father who has also now moved into the house.  Son requesting advice regarding POA paperwork.  CSW informed son pt must be of sound mind to sign any paperwork.  Son reports his father has not eaten anything in 6 days and he seems to be very rapidly declining.  CSW encouraged son to speak with Dr. Julien Nordmann regarding pt's rapid decline.  Son inquired about hospice services which CSW briefly explained.  Son to contact the office to book a follow up appointment for pt and possibly speak w/ palliative care.  CSW provided son w/ contact details.  CSW to remain available to provide assistance/support as appropriate.  ? ? ? ?[name] , LCSW ?

## 2022-02-24 NOTE — Progress Notes (Signed)
Patient's son Koron Godeaux called after receiving my card per my request. ? ?Introduced myself as Arboriculturist and to offer available resources.  ? ?Discussed one-time $1000 [employer] grant to assist with personal expenses while going through treatment. Advised what is needed her patient and spouse to apply. He verbalized understanding and may be able to provide at next visit. ? ?He also had questions regarding POA paperwork. Advised I would have a Education officer, museum to follow up and coordinate this. He verbalized understanding. ? ?He has my card for any additional financial questions or concerns. ? ?Staff message sent to social worker team. ?

## 2022-02-24 NOTE — Telephone Encounter (Signed)
Notified pt son of new appointment, son agreeable and has no further questions or concerns at this time.  ?

## 2022-02-25 DIAGNOSIS — Z95828 Presence of other vascular implants and grafts: Secondary | ICD-10-CM | POA: Insufficient documentation

## 2022-02-26 ENCOUNTER — Telehealth: Payer: Self-pay

## 2022-02-26 ENCOUNTER — Other Ambulatory Visit: Payer: Self-pay

## 2022-02-26 ENCOUNTER — Inpatient Hospital Stay: Payer: Medicare HMO | Attending: Physician Assistant

## 2022-02-26 ENCOUNTER — Encounter: Payer: Self-pay | Admitting: Internal Medicine

## 2022-02-26 ENCOUNTER — Inpatient Hospital Stay (HOSPITAL_BASED_OUTPATIENT_CLINIC_OR_DEPARTMENT_OTHER): Payer: Medicare HMO | Admitting: Nurse Practitioner

## 2022-02-26 ENCOUNTER — Encounter: Payer: Self-pay | Admitting: Nurse Practitioner

## 2022-02-26 VITALS — BP 138/72 | HR 74 | Temp 97.8°F | Resp 18 | Ht 68.0 in | Wt 120.0 lb

## 2022-02-26 DIAGNOSIS — C3492 Malignant neoplasm of unspecified part of left bronchus or lung: Secondary | ICD-10-CM

## 2022-02-26 DIAGNOSIS — C7971 Secondary malignant neoplasm of right adrenal gland: Secondary | ICD-10-CM | POA: Diagnosis not present

## 2022-02-26 DIAGNOSIS — C7951 Secondary malignant neoplasm of bone: Secondary | ICD-10-CM | POA: Diagnosis not present

## 2022-02-26 DIAGNOSIS — B370 Candidal stomatitis: Secondary | ICD-10-CM

## 2022-02-26 DIAGNOSIS — G893 Neoplasm related pain (acute) (chronic): Secondary | ICD-10-CM | POA: Insufficient documentation

## 2022-02-26 DIAGNOSIS — Z7189 Other specified counseling: Secondary | ICD-10-CM

## 2022-02-26 DIAGNOSIS — Z95828 Presence of other vascular implants and grafts: Secondary | ICD-10-CM

## 2022-02-26 DIAGNOSIS — F419 Anxiety disorder, unspecified: Secondary | ICD-10-CM

## 2022-02-26 DIAGNOSIS — C3432 Malignant neoplasm of lower lobe, left bronchus or lung: Secondary | ICD-10-CM

## 2022-02-26 DIAGNOSIS — R630 Anorexia: Secondary | ICD-10-CM

## 2022-02-26 DIAGNOSIS — Z515 Encounter for palliative care: Secondary | ICD-10-CM

## 2022-02-26 DIAGNOSIS — R531 Weakness: Secondary | ICD-10-CM

## 2022-02-26 DIAGNOSIS — R634 Abnormal weight loss: Secondary | ICD-10-CM

## 2022-02-26 LAB — CMP (CANCER CENTER ONLY)
ALT: 50 U/L — ABNORMAL HIGH (ref 0–44)
AST: 50 U/L — ABNORMAL HIGH (ref 15–41)
Albumin: 3 g/dL — ABNORMAL LOW (ref 3.5–5.0)
Alkaline Phosphatase: 537 U/L — ABNORMAL HIGH (ref 38–126)
Anion gap: 11 (ref 5–15)
BUN: 68 mg/dL — ABNORMAL HIGH (ref 8–23)
CO2: 22 mmol/L (ref 22–32)
Calcium: 8.3 mg/dL — ABNORMAL LOW (ref 8.9–10.3)
Chloride: 98 mmol/L (ref 98–111)
Creatinine: 2.99 mg/dL — ABNORMAL HIGH (ref 0.61–1.24)
GFR, Estimated: 22 mL/min — ABNORMAL LOW (ref >60)
Glucose, Bld: 134 mg/dL — ABNORMAL HIGH (ref 70–99)
Potassium: 4.2 mmol/L (ref 3.5–5.1)
Sodium: 131 mmol/L — ABNORMAL LOW (ref 135–145)
Total Bilirubin: 0.7 mg/dL (ref 0.3–1.2)
Total Protein: 6.9 g/dL (ref 6.5–8.1)

## 2022-02-26 LAB — CBC WITH DIFFERENTIAL (CANCER CENTER ONLY)
Abs Immature Granulocytes: 1.93 10*3/uL — ABNORMAL HIGH (ref 0.00–0.07)
Basophils Absolute: 0.1 10*3/uL (ref 0.0–0.1)
Basophils Relative: 0 %
Eosinophils Absolute: 0.2 10*3/uL (ref 0.0–0.5)
Eosinophils Relative: 1 %
HCT: 34.9 % — ABNORMAL LOW (ref 39.0–52.0)
Hemoglobin: 12.9 g/dL — ABNORMAL LOW (ref 13.0–17.0)
Immature Granulocytes: 5 %
Lymphocytes Relative: 3 %
Lymphs Abs: 1.1 10*3/uL (ref 0.7–4.0)
MCH: 32.1 pg (ref 26.0–34.0)
MCHC: 37 g/dL — ABNORMAL HIGH (ref 30.0–36.0)
MCV: 86.8 fL (ref 80.0–100.0)
Monocytes Absolute: 0.9 10*3/uL (ref 0.1–1.0)
Monocytes Relative: 2 %
Neutro Abs: 34.6 10*3/uL — ABNORMAL HIGH (ref 1.7–7.7)
Neutrophils Relative %: 89 %
Platelet Count: 127 10*3/uL — ABNORMAL LOW (ref 150–400)
RBC: 4.02 MIL/uL — ABNORMAL LOW (ref 4.22–5.81)
RDW: 13.4 % (ref 11.5–15.5)
WBC Count: 38.8 10*3/uL — ABNORMAL HIGH (ref 4.0–10.5)
nRBC: 0 % (ref 0.0–0.2)

## 2022-02-26 MED ORDER — CHLORPROMAZINE HCL 10 MG PO TABS: 10.0000 mg | ORAL_TABLET | Freq: Three times a day (TID) | ORAL | 2 refills | Status: AC | PRN

## 2022-02-26 MED ORDER — OXYCODONE HCL 5 MG PO TABS: 5.0000 mg | ORAL_TABLET | ORAL | 0 refills | Status: AC | PRN

## 2022-02-26 MED ORDER — NYSTATIN 100000 UNIT/ML MT SUSP: 5.0000 mL | Freq: Three times a day (TID) | OROMUCOSAL | 1 refills | Status: AC

## 2022-02-26 MED ORDER — HEPARIN SOD (PORK) LOCK FLUSH 100 UNIT/ML IV SOLN
500.0000 [IU] | Freq: Once | INTRAVENOUS | Status: AC
  Administered 2022-02-26: 500 [IU]

## 2022-02-26 MED ORDER — LORAZEPAM 0.5 MG PO TABS: 0.5000 mg | ORAL_TABLET | Freq: Three times a day (TID) | ORAL | 0 refills | Status: AC | PRN

## 2022-02-26 MED ORDER — SODIUM CHLORIDE 0.9% FLUSH
10.0000 mL | Freq: Once | INTRAVENOUS | Status: AC
  Administered 2022-02-26: 10 mL

## 2022-02-26 MED ORDER — FENTANYL 25 MCG/HR TD PT72
1.0000 | MEDICATED_PATCH | TRANSDERMAL | 0 refills | Status: AC
Start: 2022-02-26 — End: ?

## 2022-02-26 MED ORDER — MIRTAZAPINE 15 MG PO TABS: 15.0000 mg | ORAL_TABLET | Freq: Every day | ORAL | 2 refills | Status: AC

## 2022-02-26 NOTE — Progress Notes (Signed)
? ?  ?Palliative Medicine ?[place]  ?[phone] [phone] ? ? ?Name: Donald Pacheco ?Date: 02/26/2022 ?MRN: [id number]  ?DOB: Jul 11, 1951 ? ?Patient Care Team: ?de [place], Blondell Reveal, MD as PCP - General (Family Medicine) ?Valrie Hart, RN as Oncology Nurse Navigator (Oncology)  ? ? ?REASON FOR CONSULTATION: ?Donald Pacheco is a 71 y.o. male with medical history including stage IVb non-small cell lung cancer with left hilar and mediastinal lymphadenopathy, metastasis to right adrenal gland and C3 lesion, hypertension, and tobacco use.  Palliative ask to see for symptom management and goals of care.  ? ? ?SOCIAL HISTORY:    ? reports that he has been smoking cigarettes. He has a 10.00 pack-year smoking history. He does not have any smokeless tobacco history on file. He reports that he does not currently use alcohol. He reports current drug use. Drug: Marijuana. ? ?ADVANCE DIRECTIVES:  ?Patient does not have a documented advanced directive. He has packet that has been completed and only requires notarizing. He and son plan to finalize today after his visits. MOST form completed today (02/26/22).  ? ?CODE STATUS: DNR ? ?PAST MEDICAL HISTORY: ?Past Medical History:  ?Diagnosis Date  ? Dyspnea   ? HYPERCHOLESTEROLEMIA 12/24/2006  ? HYPERTENSION, BENIGN ESSENTIAL 12/11/2009  ? TOBACCO DEPENDENCE 12/24/2006  ? 40 years 1/2 PPD. 20 pack years.   ? ? ?PAST SURGICAL HISTORY:  ?Past Surgical History:  ?Procedure Laterality Date  ? FINE NEEDLE ASPIRATION  01/23/2022  ? Procedure: FINE NEEDLE ASPIRATION (FNA) EBUS;  Surgeon: Candee Furbish, MD;  Location: [facility] ENDOSCOPY;  Service: Pulmonary;;  ? IR IMAGING GUIDED PORT INSERTION  02/11/2022  ? IR RADIOLOGIST EVAL & MGMT  02/11/2022  ? VIDEO BRONCHOSCOPY WITH ENDOBRONCHIAL ULTRASOUND Right 01/23/2022  ? Procedure: VIDEO BRONCHOSCOPY WITH ENDOBRONCHIAL ULTRASOUND;  Surgeon: Candee Furbish, MD;  Location: [facility] ENDOSCOPY;  Service: Pulmonary;   Laterality: Right;  ? ? ?HEMATOLOGY/ONCOLOGY HISTORY:  ?Oncology History  ?Non-small cell carcinoma of left lung, stage 4 ([place])  ?02/06/2022 Initial Diagnosis  ? Non-small cell carcinoma of left lung, stage 4 ([place]) ? ?  ?02/06/2022 Cancer Staging  ? Staging form: Lung, AJCC 8th Edition ?- Clinical: Stage IVB (cT3, cN2, cM1c) - Signed by Curt Bears, MD on 02/06/2022 ? ?  ?02/12/2022 -  Chemotherapy  ? Patient is on Treatment Plan : LUNG NSCLC Carboplatin (6) + Paclitaxel (200) + Pembrolizumab (200) D1 q21d x 4 cycles / Pembrolizumab (200) Maintenance D1 q21d  ? ?  ?  ?02/13/2022 - 02/13/2022 Chemotherapy  ? Patient is on Treatment Plan : LUNG Carboplatin (5) + Pemetrexed (500) + Pembrolizumab (200) D1 q21d Induction x 4 cycles / Maintenance Pemetrexed (500) + Pembrolizumab (200) D1 q21d  ? ?  ?  ? ? ?ALLERGIES:  has No Known Allergies. ? ?MEDICATIONS:  ?Current Outpatient Medications  ?Medication Sig Dispense Refill  ? chlorproMAZINE (THORAZINE) 10 MG tablet Take 1 tablet (10 mg total) by mouth 3 (three) times daily as needed for hiccoughs. 90 tablet 2  ? fentaNYL (DURAGESIC) 25 MCG/HR Place 1 patch onto the skin every 3 (three) days. 10 patch 0  ? LORazepam (ATIVAN) 0.5 MG tablet Take 1 tablet (0.5 mg total) by mouth every 8 (eight) hours as needed for anxiety. 30 tablet 0  ? mirtazapine (REMERON) 15 MG tablet Take 1 tablet (15 mg total) by mouth at bedtime. 30 tablet 2  ? nicotine (NICODERM CQ - DOSED IN MG/24 HOURS) 14 mg/24hr patch Place  1 patch (14 mg total) onto the skin daily. 28 patch 0  ? oxyCODONE (OXY IR/ROXICODONE) 5 MG immediate release tablet Take 1-2 tablets (5-10 mg total) by mouth every 4 (four) hours as needed for moderate pain or severe pain. 120 tablet 0  ? prochlorperazine (COMPAZINE) 10 MG tablet Take 1 tablet (10 mg total) by mouth every 6 (six) hours as needed for nausea or vomiting. 30 tablet 0  ? senna (SENOKOT) 8.6 MG TABS tablet Take 2 tablets by mouth daily.    ? ?No current  facility-administered medications for this visit.  ? ? ?VITAL SIGNS: ?BP 138/72 (BP Location: Left Arm, Patient Position: Sitting)   Pulse 74   Temp 97.8 ?F (36.6 ?C) (Oral)   Resp 18   Ht 5\' 8"  (1.727 m)   Wt 120 lb (54.4 kg)   SpO2 99%   BMI 18.25 kg/m?  ?Filed Weights  ? 02/26/22 1039  ?Weight: 120 lb (54.4 kg)  ?  ?Estimated body mass index is 18.25 kg/m? as calculated from the following: ?  Height as of this encounter: 5\' 8"  (1.727 m). ?  Weight as of this encounter: 120 lb (54.4 kg). ? ?LABS: ?CBC: ?   ?Component Value Date/Time  ? WBC 38.8 (H) 02/26/2022 1158  ? WBC 8.8 01/24/2022 0351  ? HGB 12.9 (L) 02/26/2022 1158  ? HCT 34.9 (L) 02/26/2022 1158  ? PLT 127 (L) 02/26/2022 1158  ? MCV 86.8 02/26/2022 1158  ? NEUTROABS 34.6 (H) 02/26/2022 1158  ? LYMPHSABS 1.1 02/26/2022 1158  ? MONOABS 0.9 02/26/2022 1158  ? EOSABS 0.2 02/26/2022 1158  ? BASOSABS 0.1 02/26/2022 1158  ? ?Comprehensive Metabolic Panel: ?   ?Component Value Date/Time  ? NA 131 (L) 02/26/2022 1158  ? K 4.2 02/26/2022 1158  ? CL 98 02/26/2022 1158  ? CO2 22 02/26/2022 1158  ? BUN 68 (H) 02/26/2022 1158  ? CREATININE 2.99 (H) 02/26/2022 1158  ? GLUCOSE 134 (H) 02/26/2022 1158  ? CALCIUM 8.3 (L) 02/26/2022 1158  ? AST 50 (H) 02/26/2022 1158  ? ALT 50 (H) 02/26/2022 1158  ? ALKPHOS 537 (H) 02/26/2022 1158  ? BILITOT 0.7 02/26/2022 1158  ? PROT 6.9 02/26/2022 1158  ? ALBUMIN 3.0 (L) 02/26/2022 1158  ? ? ?RADIOGRAPHIC STUDIES: ?NM PET Image Initial (PI) Skull Base To Thigh ? ?Result Date: 02/01/2022 ?CLINICAL DATA:  Initial treatment strategy for non-small cell lung cancer. EXAM: NUCLEAR MEDICINE PET SKULL BASE TO THIGH TECHNIQUE: 6.49 mCi F-18 FDG was injected intravenously. Full-ring PET imaging was performed from the skull base to thigh after the radiotracer. CT data was obtained and used for attenuation correction and anatomic localization. Fasting blood glucose: 109 mg/dl COMPARISON:  Chest CT 01/22/2022. FINDINGS: Mediastinal blood pool  activity: SUV max 1.93 Liver activity: SUV max NA NECK: Hypermetabolism is noted in the region of the right thyroid cartilage. No obvious CT abnormality. Possible metastatic focus. No neck adenopathy. Incidental CT findings: none CHEST: The 5 cm left lower lobe lung lesion is hypermetabolic with SUV max of 4.74 and consistent with known non-small cell lung cancer. Hypermetabolic left hilar adenopathy with SUV max of 6.33. There is also contralateral mediastinal adenopathy with 2.2 cm right paratracheal node demonstrating SUV max of 9.90. Hypermetabolic subcarinal adenopathy with SUV max of 5.50. Persistent lingular nodular opacity with SUV max of 2.12. This is indeterminate but more likely inflammation or atelectasis. Incidental CT findings: Stable mild cardiac enlargement and age advanced aortic and coronary artery calcifications. ABDOMEN/PELVIS: 15  mm right adrenal gland nodule is hypermetabolic with SUV max of 6.01 and consistent with a metastatic focus. No left adrenal gland lesions. No evidence of hepatic metastatic disease. 2 cm left retroperitoneal lymph node adjacent to the left kidney has an SUV max of 7.54 and is consistent with metastatic adenopathy. 3 cm soft tissue mass between the right iliopsoas muscle and the right paraspinal muscle is hypermetabolic with SUV max of 0.93. Right medial external iliac lymph node measures 12 mm and the SUV max is 7.26. Incidental CT findings: Age advanced atherosclerotic calcifications involving the aorta and branch vessels but no aneurysm. Cholelithiasis is noted. 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 SKELETON: Diffuse hypermetabolic osseous metastatic disease which is sclerotic on the CT scan. This involves the sternum, ribs, spine and pelvis. Index lesions: 14 mm lesion in the manubrium of the sternum has an SUV max of 7.45. L1 lesion involves most of the vertebral body and has an SUV max of  7.13. Right acetabular lesion has an SUV max of 7.58 Muscle metastasis involving the right oblique abdominal muscles on image 129/4 measures 14 mm and SUV max is 8.19 Incidental CT findings: none IMPRESSION: 1. 5 cm left lower lob

## 2022-02-26 NOTE — Telephone Encounter (Signed)
Called Margaretmary Eddy RN at [employer] to place referral for [employer]. ?

## 2022-02-26 NOTE — Progress Notes (Signed)
Patient provided needed documentation for [employer] grant at registration. ? ?He has a copy of the approval letter and expense sheet along with the Outpatient Pharmacy information. He has been instructed to call me to discuss details once he gets home and settled. ? ?He has my card for any additional financial questions or concerns. ?

## 2022-03-01 ENCOUNTER — Encounter (HOSPITAL_BASED_OUTPATIENT_CLINIC_OR_DEPARTMENT_OTHER): Payer: Self-pay | Admitting: Family Medicine

## 2022-03-01 ENCOUNTER — Encounter: Payer: Self-pay | Admitting: Internal Medicine

## 2022-03-03 ENCOUNTER — Other Ambulatory Visit: Payer: Medicare HMO | Admitting: Licensed Clinical Social Worker

## 2022-03-06 ENCOUNTER — Inpatient Hospital Stay: Payer: Medicare HMO

## 2022-03-06 ENCOUNTER — Encounter (HOSPITAL_COMMUNITY): Payer: Self-pay

## 2022-03-06 ENCOUNTER — Inpatient Hospital Stay: Payer: Medicare HMO | Admitting: Internal Medicine

## 2022-03-06 ENCOUNTER — Encounter: Payer: Self-pay | Admitting: *Deleted

## 2022-03-06 NOTE — Progress Notes (Signed)
Oncology Nurse Navigator Documentation ? ? ?  03/06/2022  ? 10:00 AM 02/13/2022  ? 11:00 AM 02/07/2022  ? 11:00 AM 01/28/2022  ?  2:00 PM 01/24/2022  ?  8:00 AM  ?Oncology Nurse Navigator Flowsheets  ?Abnormal Finding Date     01/22/2022  ?Confirmed Diagnosis Date     01/23/2022  ?Diagnosis Status   Confirmed Diagnosis Complete    ?Planned Course of Treatment   Chemotherapy;Radiation;Targeted Therapy    ?Phase of Treatment  Targeted Therapy Radiation    ?Chemotherapy Actual Start Date:  02/12/2022     ?Radiation Actual Start Date:  02/06/2022 02/07/2022    ?Targeted Therapy Actual Start Date:  02/12/2022     ?Navigator Follow Up Date:  02/19/2022 02/11/2022 02/06/2022   ?Navigator Follow Up Reason:  Follow-up Appointment Appointment Review New Patient Appointment   ?Navigation Complete Date: 03/06/2022      ?Post Navigation: Continue to Follow Patient? No      ?Reason Not Navigating Patient: Hospice/Death      ?Navigator Location [facility]  ?Referral Date to RadOnc/MedOnc     01/23/2022  ?Navigator Encounter Type Appt/Treatment Plan Review/I followed up on tx plan. Patient is going with Hospice care.  Appt/Treatment Plan Review Clinic/MDC;Appt/Treatment Plan Review;Initial MedOnc Other: Other:  ?Treatment Initiated Date   02/07/2022    ?Patient Visit Type   Initial;MedOnc;Other    ?Treatment Phase  Treatment Pre-Tx/Tx Discussion  Abnormal Scans  ?Barriers/Navigation Needs  Coordination of Care Education Coordination of Care Coordination of Care  ?Education   Newly Diagnosed Cancer Education;Other;Smoking cessation    ?Interventions  Coordination of Care Education;Psycho-Social Support Coordination of Care Coordination of Care  ?Acuity Level 1-No Barriers Level 2-Minimal Needs (1-2 Barriers Identified) Level 3-Moderate Needs (3-4 Barriers Identified) Level 2-Minimal Needs (1-2 Barriers Identified) Level 3-Moderate Needs (3-4 Barriers Identified)  ?Coordination  of Care  Other Other Other Other  ?Education Method   Verbal;Other    ?Time Spent with Patient 15 30 45 30 30  ?  ?

## 2022-03-08 ENCOUNTER — Inpatient Hospital Stay: Payer: Medicare HMO

## 2022-03-10 ENCOUNTER — Inpatient Hospital Stay: Payer: Medicare HMO

## 2022-03-11 ENCOUNTER — Other Ambulatory Visit (HOSPITAL_COMMUNITY): Payer: Self-pay | Admitting: Interventional Radiology

## 2022-03-11 DIAGNOSIS — C7951 Secondary malignant neoplasm of bone: Secondary | ICD-10-CM

## 2022-03-11 DIAGNOSIS — S22080S Wedge compression fracture of T11-T12 vertebra, sequela: Secondary | ICD-10-CM

## 2022-03-12 ENCOUNTER — Inpatient Hospital Stay: Payer: Medicare HMO

## 2022-03-19 ENCOUNTER — Telehealth: Payer: Self-pay

## 2022-03-19 ENCOUNTER — Other Ambulatory Visit: Payer: Medicare HMO

## 2022-03-19 ENCOUNTER — Telehealth: Payer: Self-pay | Admitting: *Deleted

## 2022-03-19 NOTE — Telephone Encounter (Signed)
Pt daughter called wanting to know if her FMLA form can be completed.  I have called her back and provided her with the contact phone, fax and e-mail address to that department. She expressed understanding of this information.

## 2022-03-19 NOTE — Telephone Encounter (Signed)
"  Donald Pacheco [phone]).  I have FMLA paperwork to care for my father and do not know or understand what to do.  I miss work to care for him in the home.  Hospice comes but it's hard on me.  I need to work to provide some financial support for them.  My mother is there when I leave and sometimes my brother but not a lot of help or support caring for my Dad.  I Work 3:00 pm through 11:30 pm for [employer], Tuesday through Saturday.  I do not have any vacation time left.  Do not know how long I will need to care for him.  He is very weak, can barely hold a cup, spending most of the time in bed.  Hospice comes for end of life care.  This is hard."  This nurse expressed consolation as daughter became tearful; expressing situational distress and possible loss of parent.  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  Consult with [employer] to ensure how many hours you are required to work to maintain your insurance benefit and employment.  If decide reduced work hours by late arrival or early departure, confirm family coverage or relief ensuring you arrive to work on time.  Walked through how to obtain required use and disclosure on [email or web address] to deliver with FMLA form.    Currently no further questions or needs.  Awaiting form delivery.

## 2022-03-20 ENCOUNTER — Ambulatory Visit (HOSPITAL_COMMUNITY): Payer: Medicare HMO

## 2022-03-20 ENCOUNTER — Other Ambulatory Visit (HOSPITAL_COMMUNITY): Payer: Medicare HMO

## 2022-03-26 ENCOUNTER — Ambulatory Visit: Admission: RE | Admit: 2022-03-26 | Payer: Medicare HMO | Source: Ambulatory Visit | Admitting: Urology

## 2022-03-26 ENCOUNTER — Telehealth: Payer: Self-pay

## 2022-03-26 ENCOUNTER — Ambulatory Visit: Payer: Medicare HMO

## 2022-03-26 ENCOUNTER — Ambulatory Visit: Payer: Medicare HMO | Admitting: Physician Assistant

## 2022-03-26 ENCOUNTER — Ambulatory Visit: Payer: Self-pay | Admitting: Urology

## 2022-03-26 ENCOUNTER — Other Ambulatory Visit: Payer: Medicare HMO

## 2022-03-26 NOTE — Telephone Encounter (Signed)
Left voicemail regarding today's 03/26/22 telephone appointment cancellation w/ Donald Bruning PA-C. Patient is now in hospice care. I left my extension [phone] in case patient needs anything.

## 2022-03-28 ENCOUNTER — Ambulatory Visit: Payer: Medicare HMO

## 2022-04-02 ENCOUNTER — Ambulatory Visit (HOSPITAL_BASED_OUTPATIENT_CLINIC_OR_DEPARTMENT_OTHER): Payer: Medicare HMO | Admitting: Family Medicine

## 2022-04-03 ENCOUNTER — Other Ambulatory Visit: Payer: Medicare HMO

## 2022-04-09 ENCOUNTER — Other Ambulatory Visit: Payer: Medicare HMO

## 2022-04-17 ENCOUNTER — Other Ambulatory Visit: Payer: Medicare HMO

## 2022-04-17 ENCOUNTER — Ambulatory Visit: Payer: Medicare HMO

## 2022-04-17 ENCOUNTER — Ambulatory Visit: Payer: Medicare HMO | Admitting: Physician Assistant

## 2022-04-19 ENCOUNTER — Ambulatory Visit: Payer: Medicare HMO

## 2022-06-16 ENCOUNTER — Encounter: Payer: Self-pay | Admitting: Internal Medicine

## 2022-06-16 NOTE — Progress Notes (Signed)
  Radiation Oncology         [phone] ________________________________  Name: Donald Pacheco MRN: [id number]  Date: 02/19/2022  DOB: [birth year]  End of Treatment Note  Diagnosis:     70 y.o. patient with thoracic and lumbar spinal metastasis from left lower lung cancer     Indication for treatment:  Palliation       Radiation treatment dates:   4/13-4/26/23  Site/dose:   T11-L2 Spine treated to 30 Gy in 10 fractions of 3 Gy  Beams/energy:   3D conformal 6 MV with 2 static fields and one DCA  Narrative: The patient tolerated radiation treatment relatively well.     Plan: The patient has completed radiation treatment. The patient will return to radiation oncology clinic for routine followup in one month. I advised him to call or return sooner if he has any questions or concerns related to his recovery or treatment. ________________________________  Sheral Apley. Tammi Klippel, M.D.

## 2022-06-23 ENCOUNTER — Telehealth: Payer: Self-pay | Admitting: *Deleted

## 2022-06-23 NOTE — Telephone Encounter (Signed)
Received notification from [place] that patient died 07-17-2022 @ 0932

## 2022-06-27 DEATH — deceased

## 2023-07-03 NOTE — Progress Notes (Signed)
This encounter was created in error - please disregard.

## 2023-09-05 IMAGING — XA IR IMAGING GUIDED PORT INSERTION
1 series · 2 of 2 positions shown · non-contrast
Comparison: none

INDICATION: Cancer of left lower lobe of lung

[Series 1: ir fluoro/shunt/fist · 2 of 2 slices shown]
[im 1/2]
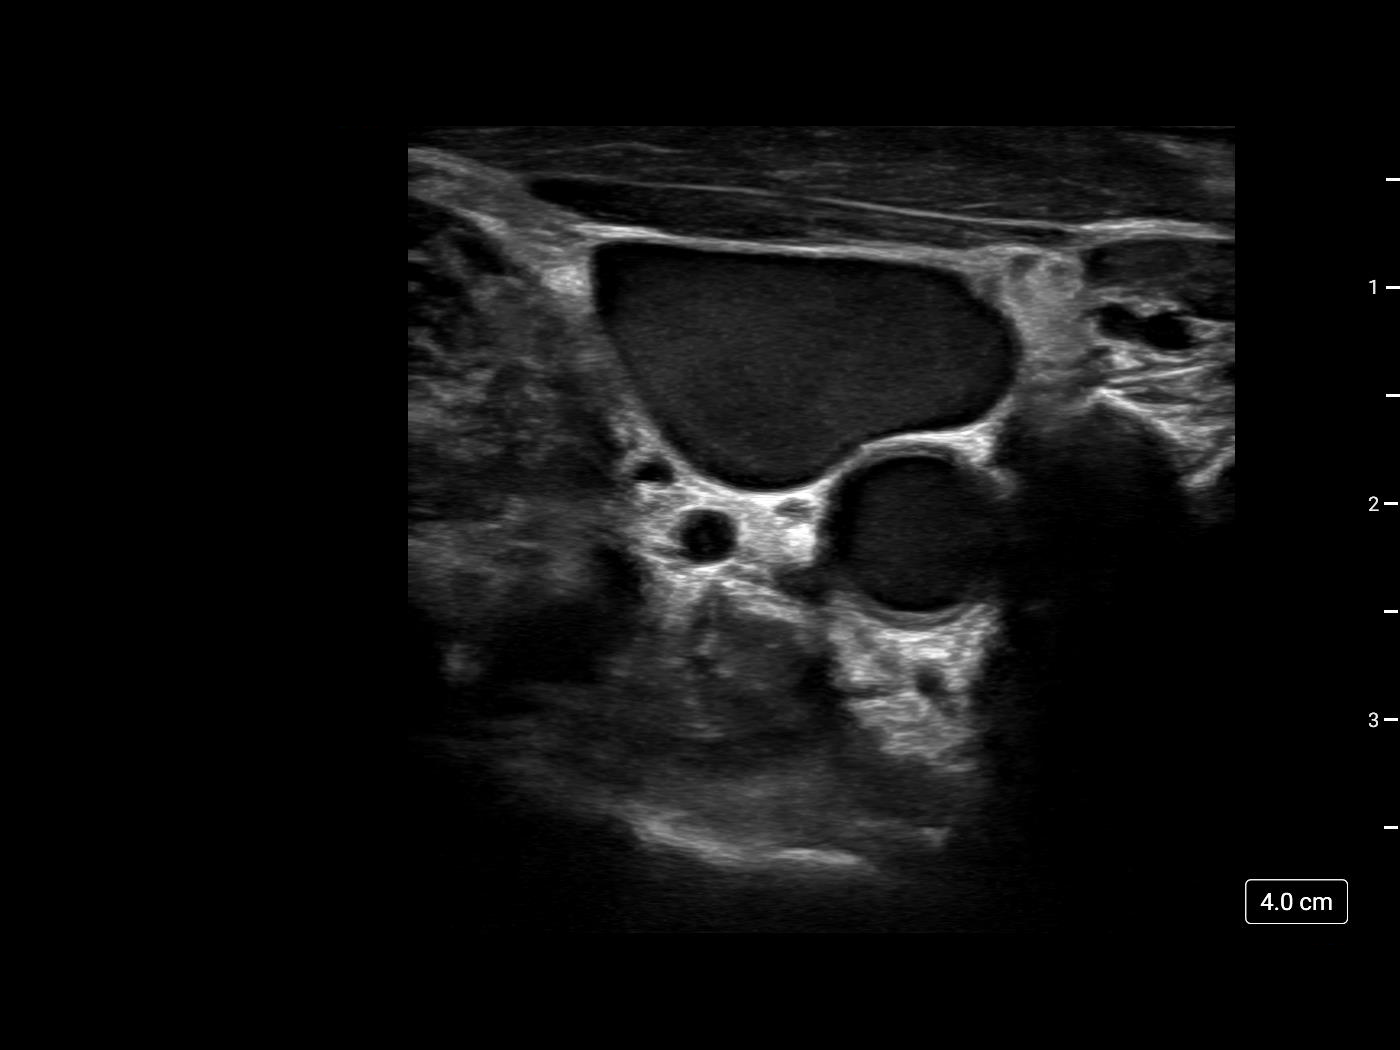
[im 2/2]
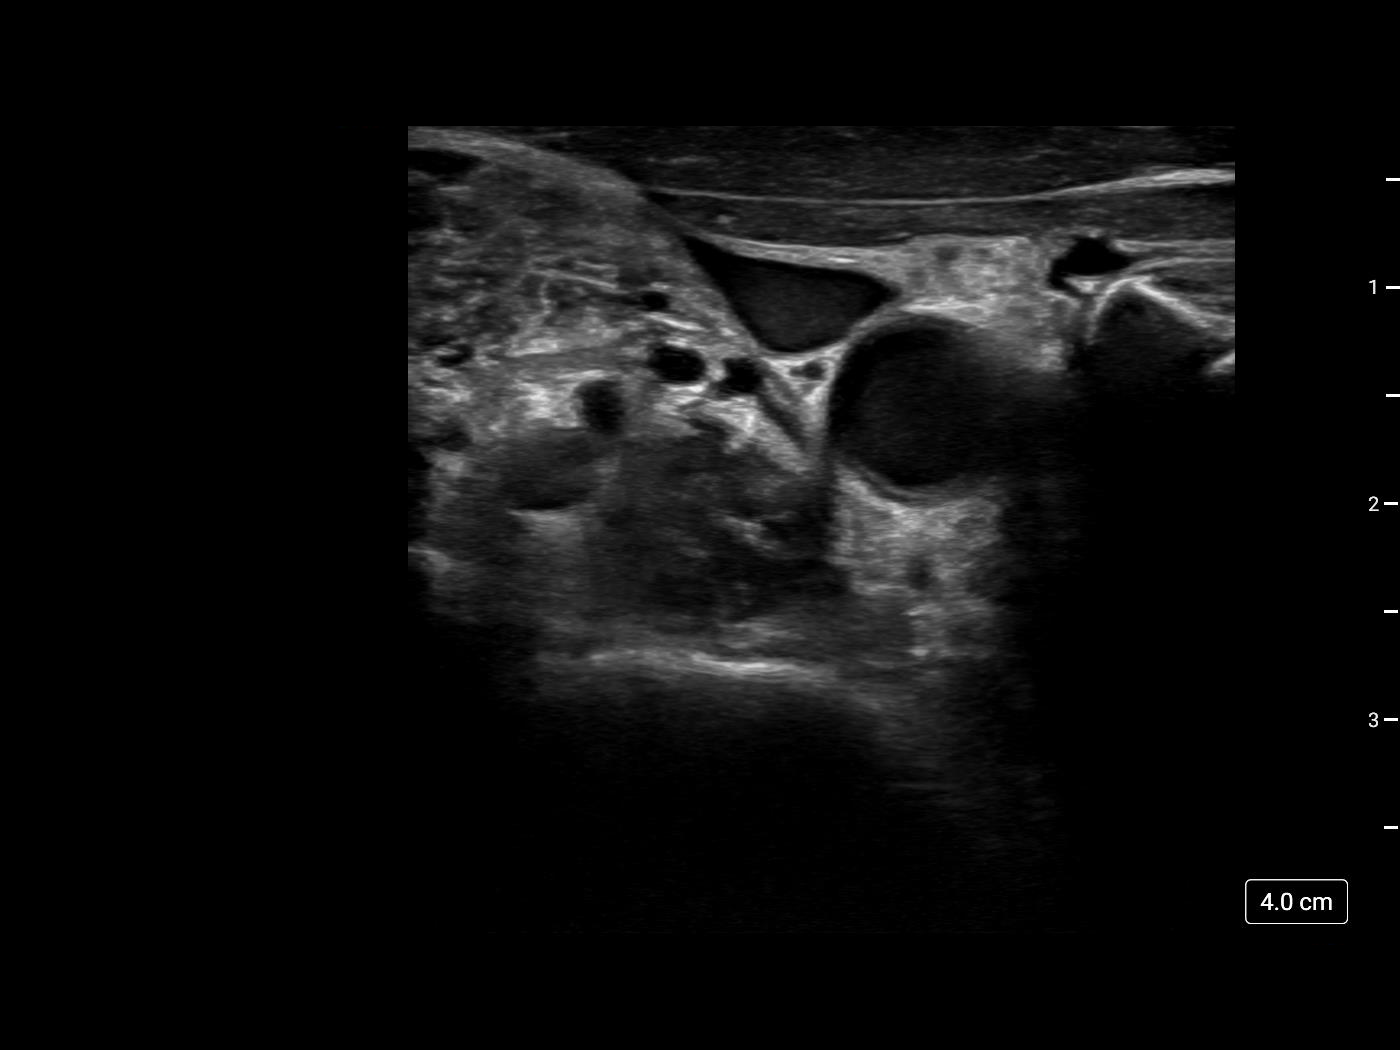

[2 of 2 positions shown; findings below may reference images not displayed]

EXAM:
IMPLANTED PORT A CATH PLACEMENT WITH ULTRASOUND AND FLUOROSCOPIC
GUIDANCE

MEDICATIONS:
None

ANESTHESIA/SEDATION:
Moderate (conscious) sedation was employed during this procedure. A
total of Versed 2 mg and Fentanyl 100 mcg was administered
intravenously.

Moderate Sedation Time: 23 minutes. The patient's level of
consciousness and vital signs were monitored continuously by
radiology nursing throughout the procedure under my direct
supervision.

FLUOROSCOPY TIME:  Fluoroscopic dose; 1 mGy

COMPLICATIONS:
None immediate.

PROCEDURE:
The procedure, risks, benefits, and alternatives were explained to
the patient. Questions regarding the procedure were encouraged and
answered. The patient understands and consents to the procedure.

The RIGHT neck and chest were prepped with chlorhexidine in a
sterile fashion, and a sterile drape was applied covering the
operative field. Maximum barrier sterile technique with sterile
gowns and gloves were used for the procedure. A timeout was
performed prior to the initiation of the procedure. Local anesthesia
was provided with 1% lidocaine with epinephrine.

After creating a small venotomy incision, a micropuncture kit was
utilized to access the internal jugular vein under direct, real-time
ultrasound guidance. Ultrasound image documentation was performed.
The microwire was kinked to measure appropriate catheter length.

A subcutaneous port pocket was then created along the upper chest
wall utilizing a combination of sharp and blunt dissection. The
pocket was irrigated with sterile saline. A single lumen ISP power
injectable port was chosen for placement. The 8 Fr catheter was
tunneled from the port pocket site to the venotomy incision. The
port was placed in the pocket. The external catheter was trimmed to
appropriate length. At the venotomy, an 8 Fr peel-away sheath was
placed over a guidewire under fluoroscopic guidance. The catheter
was then placed through the sheath and the sheath was removed. Final
catheter positioning was confirmed and documented with a
fluoroscopic spot radiograph. The port was accessed with Milanie Mallela
needle, aspirated and flushed with heparinized saline.

The port pocket incision was closed with interrupted 3-0 Vicryl
suture then Dermabond was applied, including at the venotomy
incision. Dressings were placed. The patient tolerated the procedure
well without immediate post procedural complication.
IMPRESSION: Successful placement of a RIGHT internal jugular approach power
injectable Port-A-Cath.

The tip of the catheter is positioned within the proximal RIGHT
atrium. The catheter is ready for immediate use.
